# Patient Record
Sex: Female | Born: 1950
Health system: Southern US, Community
[De-identification: ages and names within clinical notes are randomized; demographics above are authoritative.]

## PROBLEM LIST (undated history)

## (undated) DIAGNOSIS — I2510 Atherosclerotic heart disease of native coronary artery without angina pectoris: Secondary | ICD-10-CM

## (undated) DIAGNOSIS — E785 Hyperlipidemia, unspecified: Secondary | ICD-10-CM

## (undated) DIAGNOSIS — I10 Essential (primary) hypertension: Secondary | ICD-10-CM

## (undated) DIAGNOSIS — Z720 Tobacco use: Secondary | ICD-10-CM

## (undated) DIAGNOSIS — N814 Uterovaginal prolapse, unspecified: Secondary | ICD-10-CM

## (undated) DIAGNOSIS — K759 Inflammatory liver disease, unspecified: Secondary | ICD-10-CM

## (undated) HISTORY — PX: TUBAL LIGATION: SHX77

## (undated) HISTORY — DX: Tobacco use: Z72.0

## (undated) HISTORY — DX: Atherosclerotic heart disease of native coronary artery without angina pectoris: I25.10

## (undated) HISTORY — DX: Essential (primary) hypertension: I10

---

## 1998-07-19 ENCOUNTER — Emergency Department (HOSPITAL_COMMUNITY): Admission: EM | Admit: 1998-07-19 | Discharge: 1998-07-19 | Payer: Self-pay | Admitting: Emergency Medicine

## 1998-10-02 ENCOUNTER — Emergency Department (HOSPITAL_COMMUNITY): Admission: EM | Admit: 1998-10-02 | Discharge: 1998-10-02 | Payer: Self-pay | Admitting: Emergency Medicine

## 1999-02-05 ENCOUNTER — Emergency Department (HOSPITAL_COMMUNITY): Admission: EM | Admit: 1999-02-05 | Discharge: 1999-02-05 | Payer: Self-pay | Admitting: Emergency Medicine

## 1999-02-15 ENCOUNTER — Encounter: Payer: Self-pay | Admitting: Emergency Medicine

## 1999-02-15 ENCOUNTER — Emergency Department (HOSPITAL_COMMUNITY): Admission: EM | Admit: 1999-02-15 | Discharge: 1999-02-15 | Payer: Self-pay | Admitting: Emergency Medicine

## 1999-05-03 ENCOUNTER — Emergency Department (HOSPITAL_COMMUNITY): Admission: EM | Admit: 1999-05-03 | Discharge: 1999-05-03 | Payer: Self-pay | Admitting: Emergency Medicine

## 2002-03-30 ENCOUNTER — Emergency Department (HOSPITAL_COMMUNITY): Admission: EM | Admit: 2002-03-30 | Discharge: 2002-03-30 | Payer: Self-pay | Admitting: Emergency Medicine

## 2002-03-30 ENCOUNTER — Encounter: Payer: Self-pay | Admitting: Emergency Medicine

## 2002-04-15 ENCOUNTER — Emergency Department (HOSPITAL_COMMUNITY): Admission: EM | Admit: 2002-04-15 | Discharge: 2002-04-15 | Payer: Self-pay | Admitting: Emergency Medicine

## 2002-04-15 ENCOUNTER — Encounter: Payer: Self-pay | Admitting: Emergency Medicine

## 2002-12-02 ENCOUNTER — Encounter: Payer: Self-pay | Admitting: Emergency Medicine

## 2002-12-02 ENCOUNTER — Emergency Department (HOSPITAL_COMMUNITY): Admission: EM | Admit: 2002-12-02 | Discharge: 2002-12-02 | Payer: Self-pay | Admitting: Emergency Medicine

## 2003-08-01 ENCOUNTER — Emergency Department (HOSPITAL_COMMUNITY): Admission: EM | Admit: 2003-08-01 | Discharge: 2003-08-01 | Payer: Self-pay | Admitting: Emergency Medicine

## 2003-12-24 ENCOUNTER — Emergency Department (HOSPITAL_COMMUNITY): Admission: EM | Admit: 2003-12-24 | Discharge: 2003-12-24 | Payer: Self-pay | Admitting: Emergency Medicine

## 2005-10-14 ENCOUNTER — Emergency Department (HOSPITAL_COMMUNITY): Admission: EM | Admit: 2005-10-14 | Discharge: 2005-10-14 | Payer: Self-pay | Admitting: Emergency Medicine

## 2007-03-05 ENCOUNTER — Emergency Department (HOSPITAL_COMMUNITY): Admission: EM | Admit: 2007-03-05 | Discharge: 2007-03-05 | Payer: Self-pay | Admitting: Emergency Medicine

## 2007-09-09 ENCOUNTER — Emergency Department (HOSPITAL_COMMUNITY): Admission: EM | Admit: 2007-09-09 | Discharge: 2007-09-09 | Payer: Self-pay | Admitting: Emergency Medicine

## 2010-04-19 ENCOUNTER — Emergency Department (HOSPITAL_COMMUNITY): Admission: EM | Admit: 2010-04-19 | Discharge: 2010-04-19 | Payer: Self-pay | Admitting: Emergency Medicine

## 2010-07-30 DIAGNOSIS — I2510 Atherosclerotic heart disease of native coronary artery without angina pectoris: Secondary | ICD-10-CM

## 2010-07-30 HISTORY — DX: Atherosclerotic heart disease of native coronary artery without angina pectoris: I25.10

## 2010-07-30 HISTORY — PX: CARDIAC CATHETERIZATION: SHX172

## 2010-08-04 ENCOUNTER — Ambulatory Visit: Payer: Self-pay | Admitting: Cardiovascular Disease

## 2010-08-08 ENCOUNTER — Telehealth (INDEPENDENT_AMBULATORY_CARE_PROVIDER_SITE_OTHER): Payer: Self-pay | Admitting: *Deleted

## 2010-08-09 ENCOUNTER — Encounter (HOSPITAL_COMMUNITY)
Admission: RE | Admit: 2010-08-09 | Discharge: 2010-08-29 | Payer: Self-pay | Source: Home / Self Care | Attending: Cardiovascular Disease | Admitting: Cardiovascular Disease

## 2010-08-09 ENCOUNTER — Encounter: Payer: Self-pay | Admitting: Cardiovascular Disease

## 2010-08-09 ENCOUNTER — Ambulatory Visit: Admission: RE | Admit: 2010-08-09 | Discharge: 2010-08-09 | Payer: Self-pay | Source: Home / Self Care

## 2010-08-10 ENCOUNTER — Ambulatory Visit: Admission: RE | Admit: 2010-08-10 | Discharge: 2010-08-10 | Payer: Self-pay | Source: Home / Self Care

## 2010-08-11 ENCOUNTER — Ambulatory Visit (HOSPITAL_COMMUNITY)
Admission: RE | Admit: 2010-08-11 | Discharge: 2010-08-12 | Payer: Self-pay | Source: Home / Self Care | Attending: Cardiovascular Disease | Admitting: Cardiovascular Disease

## 2010-08-14 ENCOUNTER — Ambulatory Visit: Payer: Self-pay | Admitting: Cardiovascular Disease

## 2010-08-14 LAB — BASIC METABOLIC PANEL
BUN: 6 mg/dL (ref 6–23)
CO2: 26 mEq/L (ref 19–32)
Calcium: 8.8 mg/dL (ref 8.4–10.5)
Chloride: 102 mEq/L (ref 96–112)
Creatinine, Ser: 0.61 mg/dL (ref 0.4–1.2)
GFR calc Af Amer: >60 mL/min (ref >60)
GFR calc non Af Amer: >60 mL/min (ref >60)
Glucose, Bld: 100 mg/dL — ABNORMAL HIGH (ref 70–99)
Potassium: 3.9 mEq/L (ref 3.5–5.1)
Sodium: 138 mEq/L (ref 135–145)

## 2010-08-14 LAB — CBC
HCT: 44.9 % (ref 36.0–46.0)
HCT: 43.4 % (ref 36.0–46.0)
Hemoglobin: 14.6 g/dL (ref 12.0–15.0)
Hemoglobin: 14.3 g/dL (ref 12.0–15.0)
MCH: 31.5 pg (ref 26.0–34.0)
MCH: 31.8 pg (ref 26.0–34.0)
MCHC: 32.5 g/dL (ref 30.0–36.0)
MCHC: 32.9 g/dL (ref 30.0–36.0)
MCV: 96.8 fL (ref 78.0–100.0)
MCV: 96.7 fL (ref 78.0–100.0)
Platelets: 180 10*3/uL (ref 150–400)
Platelets: 176 10*3/uL (ref 150–400)
RBC: 4.64 MIL/uL (ref 3.87–5.11)
RBC: 4.49 MIL/uL (ref 3.87–5.11)
RDW: 13 % (ref 11.5–15.5)
RDW: 12.9 % (ref 11.5–15.5)
WBC: 8.8 10*3/uL (ref 4.0–10.5)
WBC: 10.7 10*3/uL — ABNORMAL HIGH (ref 4.0–10.5)

## 2010-08-14 LAB — LIPID PANEL
Cholesterol: 200 mg/dL (ref 0–200)
HDL: 30 mg/dL — ABNORMAL LOW (ref >39)
LDL Cholesterol: 134 mg/dL — ABNORMAL HIGH (ref 0–99)
Total CHOL/HDL Ratio: 6.7 RATIO
Triglycerides: 182 mg/dL — ABNORMAL HIGH (ref <150)
VLDL: 36 mg/dL (ref 0–40)

## 2010-08-21 NOTE — Discharge Summary (Addendum)
Melanie Mccarty              ACCOUNT NO.:  [id number]  MEDICAL RECORD NO.:  [id number]          PATIENT TYPE:  OIB  LOCATION:  6524                         FACILITY:  [facility]  PHYSICIAN:  Colleen Can. Deborah Chalk, M.D.DATE OF BIRTH:  [birth year]  DATE OF ADMISSION:  08/11/2010 DATE OF DISCHARGE:  08/12/2010                              DISCHARGE SUMMARY   DISCHARGE DIAGNOSES: 1. Chest pain consistent with unstable angina. 2. Newly diagnosed coronary artery disease by cath, August 11, 2010     with total occluded right coronary artery treated with percutaneous     coronary intervention/Ion drug-eluting stent placement.     a.     Residual moderate left anterior descending coronary artery      and nonobstructive left circumflex stenosis, for medical therapy. 3. Hypertension. 4. Ongoing tobacco abuse, with possible chronic obstructive pulmonary     disease.  Instructed to follow up with primary care for evaluation     of lung function. 5. Normal left ventricular function by Myoview.  HOSPITAL COURSE:  Melanie Mccarty is a 60 year old female with no prior cardiac history, who presented to Dr. Elease Hashimoto with complaints of exertional chest pain, very typical of angina.  She underwent a Lexiscan Myoview study that demonstrated inferior wall ischemia with normal left ventricular function with an EF of 60%.  She was referred for cardiac catheterization and came in to the hospital on August 11, 2010 for this procedure by Dr. Excell Seltzer.  She had a total occlusion of the RCA, which was treated successfully with PCI and 3.5 x 32 mm Ion drug-eluting stent.  She did have residual nonobstructive disease in the left system, otherwise, for medical therapy.  The patient did well post procedurally. Overnight it was noted on telemetry with O2 sat monitoring that her sats did fall, so she was put on oxygen overnight.  This morning, her oxygen saturations are low 90s on room air with O2 sat ranging from 88-92  on ambulation.  Chest x-ray showed no active disease.  The patient was extensively counseled regarding smoking cessation.  She will be instructed to follow up with her primary care doctor for possible evaluation of COPD.  She was also seen by case management for Plavix assistance.  Dr. Deborah Chalk has seen and examined her today and feels she is stable for discharge.  DISCHARGE LABS:  WBC 10.6, hemoglobin of 14.3, hematocrit 43.4, platelet count 176.  Sodium 138, potassium 2.9, chloride 102, CO2 of 26, glucose 100, BUN 6, creatinine 0.61.  Lipid profile; total cholesterol 200, triglycerides 182, HDL 30, LDL 134.  STUDIES: 1. Chest x-ray on August 12, 2010 showed no acute findings. 2. Cardiac catheterization August 11, 2010, please see full report     for details as well as HPI for summary.  DISCHARGE MEDICATIONS: 1. Plavix 75 mg daily. 2. Lisinopril 5 mg daily. 3. Nitroglycerin 0.4 mg sublingual every 5 minutes as needed up to 3     doses. 4. Pravastatin 40 mg at bedtime. 5. Goody's Powder 1 packet only as needed. 6. Toprol-XL 25 mg daily. 7. Aspirin 81 mg daily.  DISPOSITION:  Ms. [name] will be  discharged in stable condition to home. She is not to return to work until August 15, 2010, and is not to lift anything or participate in sexual activity in 1 week.  She is not to drive for 2 days.  She is strongly counseled to stop smoking and follow a low-sodium, heart-healthy diet.  If she notices any pain, swelling, bleeding, or pus at the cath site, she is to call or return.  She will follow up with Dr. Elease Hashimoto in 2 weeks and our office will call her with this appointment.  She is also instructed to follow up with her primary care doctor to evaluate her lung function.  DURATION OF DISCHARGE ENCOUNTER:  Greater than 30 minutes including physician and PA time.     Dayna Dunn, P.A.C.   ______________________________ Colleen Can. Deborah Chalk, M.D.    DD/MEDQ  D:  08/12/2010  T:   08/12/2010  Job:  [id number]  cc:   Vesta Mixer, M.D.  Electronically Signed by Ronie Spies  on 08/21/2010 10:32:30 AM Electronically Signed by Roger Shelter M.D. on 08/24/2010 03:35:04 PM

## 2010-08-29 NOTE — H&P (Signed)
NAMELISMARY, KIEHN NO.:  [id number]  MEDICAL RECORD NO.:  [id number]           PATIENT TYPE:  LOCATION:                                 FACILITY:  PHYSICIAN:  Vesta Mixer, M.D. DATE OF BIRTH:  Nov 09, 1950  DATE OF ADMISSION:  08/11/2010 DATE OF DISCHARGE:                             HISTORY & PHYSICAL   HISTORY:  Melanie Mccarty is a middle-aged female with a history of chest pain.  She was recently found to have an abnormal stress Myoview study. She is now admitted for heart catheterization.  Melanie Mccarty saw me initially on August 05, 2010.  She had been having some episodes of chest pain.  These episodes have been present since last October.  The pains are described as a squeezing type chest pain. The episodes last for several minutes.  They radiate out to her arm. These episodes are brought on by exertion.  They are not associated with shortness of breath, nausea, vomiting, or diaphoresis.  She denies any syncope or presyncope.  She had a [place] Myoview study today.  She was found to have evidence of inferior wall ischemia.  Her left ventricular systolic function was normal with an ejection fraction of 60%.  CURRENT MEDICATIONS: 1. Metoprolol 25 mg a day. 2. Nitroglycerin 0.4 mg sublingually as needed.  PAST MEDICAL HISTORY: 1. History of hypertension. 2. Chest pain.  SOCIAL HISTORY:  The patient smokes 1-pack of cigarettes a day.  She works as a Conservation officer, nature.  She does not drink alcohol.  She does not get any regular exercise.  FAMILY HISTORY:  Her father died at age 81 due to congestive heart failure.  He also had diabetes.  Her mother died at age 72 of heart failure.  She also had hypertension.  She had a sister who had coronary artery bypass grafting in her 49s.  REVIEW OF SYSTEMS:  As noted in the HPI.  All other systems are negative.  PHYSICAL EXAMINATION:  GENERAL:  She is a middle-aged female in no acute distress.  She is alert and  oriented x3.  Her mood and affect are normal. VITAL SIGNS:  Her weight is 173, her blood pressure is 150/80, and her heart rate is 60. HEENT:  2+ carotids.  She has no bruits, no JVD, and no thyromegaly. Her sclerae are nonicteric.  Her mucous membranes are moist. LUNGS:  Clear. CHEST:  Her chest wall is nontender. HEART:  Regular rate, S1-S2.  She has normal heart sounds.  Her PMI is nondisplaced. ABDOMEN:  Good bowel sounds.  There is no guarding or rebound.  There is no hepatosplenomegaly.  There are no masses. EXTREMITIES:  Examination of her legs reveal that her pulses are fairly deep in her legs.  Her foot pulses are normal.  Her arm pulses are normal.  There is no palpable cords. NEUROLOGIC:  Her cranial nerves II-XII are intact and her motor and sensory function are intact.  Her gait is normal.  IMAGING:  Her EKG reveals normal sinus rhythm.  She has some nonspecific ST-T wave changes in the inferolateral leads.  IMPRESSION AND PLAN:  Chest  pain.  The patient presents with a history of chest pain that is very typical of angina.  She also has evidence of inferior ischemia.  We will schedule her for a heart catheterization tomorrow with Dr. Excell Seltzer.  We have discussed the risks, benefits, and options of heart catheterization.  She understands and agrees to proceed.     Vesta Mixer, M.D.     PJN/MEDQ  D:  08/10/2010  T:  08/11/2010  Job:  [id number]  cc:   Melvern Banker Veverly Fells. Excell Seltzer, MD  Electronically Signed by Kristeen Miss M.D. on 08/29/2010 12:14:20 PM

## 2010-08-29 NOTE — Procedures (Signed)
NAMESOMALY, MARTENEY              ACCOUNT NO.:  [id number]  MEDICAL RECORD NO.:  [id number]          PATIENT TYPE:  OIB  LOCATION:  6524                         FACILITY:  [facility]  PHYSICIAN:  Veverly Fells. Excell Seltzer, MD  DATE OF BIRTH:  07-Mar-1951  DATE OF PROCEDURE:  08/11/2010 DATE OF DISCHARGE:                           CARDIAC CATHETERIZATION   PROCEDURES: 1. Catheter placement for selective coronary angiography. 2. Selective coronary angiography. 3. Percutaneous transluminal coronary angioplasty and stenting of the     right coronary artery.  PROCEDURAL INDICATIONS:  Melanie Mccarty is a 60 year old woman who developed exertional chest pain in October 2011.  Her symptoms have been very typical of angina.  She was seen by Dr. Elease Hashimoto and underwent a Lexiscan Myoview study demonstrating inferior wall ischemia with normal LV ejection fraction of 60%.  She was referred for cardiac cath.  Risks and indications of procedure were reviewed with the patient. Informed consent was obtained.  The right wrist was prepped, draped, and anesthetized with 1% lidocaine.  Using a modified Seldinger technique, a 5-French sheath was placed in the right radial artery, 3 mg of verapamil was administered through the sheath, 4000 units of unfractionated heparin was given, standard 5-French Judkins catheters were used for coronary angiography.  This demonstrated total occlusion of the proximal right coronary artery with left-to-right collaterals.  Since the patient had an ischemic defect with preserved LV function and class III angina, I elected to try to reopen her chronic total occlusion.  An ACT was drawn and it was approximately 225.  An additional 1000 units of heparin was given.  A 5-French JR-4 guide catheter was utilized.  I initially attempted to cross the lesion with a cougar guidewire, but this would not cross.  I switched out to an over-the-wire balloon using a 1.5 x 12 mm apex with a Miracle  Brothers 3 g  CTO wire.  I was able to cross the lesion with a Miracle Brothers wire and the balloon crossed with a moderate amount of difficulty.  The balloon was inflated to 10 and 12 atmospheres on two inflations and was then pulled back. Angiography was then performed and there was a long area of severe diffuse narrowing throughout the entire proximal and mid right coronary artery where the vessel took a near 360-degree loop and then it became a larger caliber vessel in the distal RCA.  At that point, the Miracle Brothers wire was then changed out for a long Cougar guidewire and a 1-5 balloon was inflated throughout the entirety of the mid-right coronary artery.  Angiography again demonstrated severe diffuse disease with essentially no flow beyond the mid-right coronary.  A 2.5 x 40 mm balloon was then advanced and dilated to 6 atmospheres on a total of three inflations to cover the entirety of the RCA.  Intracoronary nitroglycerin was administered.  This made a dramatic difference in the appearance of the vessel and relieved much of what was presumably diffuse vasospasm.  Other than the proximal portion of the vessel, the entire vessel appeared patent.  I elected to stent the vessel at that point with a 3.5 x 32 mm  Ion drug-eluting stent.  The stent was carefully positioned so that there was coverage of the ostium and the stent was deployed at 12 atmospheres.  It was postdilated with a 3.75 x 20 mm [employer] Quantum apex which was taken to 12 atmospheres distally and 16 atmospheres proximally.  Final angiography demonstrated an excellent result with TIMI III flow and 0% residual stenosis.  DIAGNOSTIC FINDINGS:  Left mainstem has 20-30% distal left main stenosis.  It divides into the LAD and left circumflex.  LAD:  The LAD has calcification and diffuse 50% narrowing proximally. The mid vessel has minor nonobstructive plaque with associated calcification.  The diagonal branches are  patent.  Left circumflex:  The left circumflex is patent throughout its course. There are two large OM branches, just after the first OM and the midcircumflex, there is a calcific 30-40% stenosis present.  There are no areas of high-grade disease in the left circumflex.  Right coronary artery:  The right coronary artery has total occlusion at the ostium.  There is minimal antegrade flow beyond the area of total occlusion.  The PDA fills from left-to-right collaterals.  FINAL ASSESSMENT: 1. Total occlusion of the right coronary artery treated successfully     with percutaneous intervention using a drug-eluting stent platform. 2. Moderate left anterior descending stenosis. 3. Nonobstructive left circumflex stenosis.  RECOMMENDATIONS:  The patient should continue with dual antiplatelet therapy using aspirin and Plavix for a minimum of 12 months.  Aggressive risk reduction will be continued under the care of Dr. Elease Hashimoto.  Of note, the PCI procedure was performed with heparin; and once the lesion was crossed and then treated with successful balloon angioplasty, Integrilin was started as adjunctive therapy.  The patient was given 600 mg ofPlavix at the completion of the procedure and will be continued on 12 hours of Integrilin.     Veverly Fells. Excell Seltzer, MD     MDC/MEDQ  D:  08/11/2010  T:  08/12/2010  Job:  [id number]  cc:   Vesta Mixer, M.D. [facility]  Electronically Signed by Tonny Bollman MD on 08/29/2010 04:50:20 AM

## 2010-08-30 ENCOUNTER — Ambulatory Visit (INDEPENDENT_AMBULATORY_CARE_PROVIDER_SITE_OTHER): Payer: PRIVATE HEALTH INSURANCE | Admitting: Cardiovascular Disease

## 2010-08-30 DIAGNOSIS — Z9861 Coronary angioplasty status: Secondary | ICD-10-CM

## 2010-08-30 DIAGNOSIS — I119 Hypertensive heart disease without heart failure: Secondary | ICD-10-CM

## 2010-08-30 DIAGNOSIS — E7800 Pure hypercholesterolemia, unspecified: Secondary | ICD-10-CM

## 2010-08-31 NOTE — Progress Notes (Signed)
Summary: Nuclear Pre-Procedure  Phone Note Outgoing Call Call back at [facility] Phone [phone]   Call placed by: Stanton Kidney, EMT-P,  August 08, 2010 1:35 PM Action Taken: Phone Call Completed Summary of Call: Left message with information on Myoview Information Sheet (see scanned document for details). Stanton Kidney, EMT-P  August 08, 2010 1:35 PM     Nuclear Med Background Indications for Stress Test: Evaluation for Ischemia     Symptoms: Chest Pain, Chest Pain with Exertion    Nuclear Pre-Procedure Cardiac Risk Factors: Family History - CAD, Hypertension, Smoker

## 2010-08-31 NOTE — Assessment & Plan Note (Signed)
Summary: Cardiology Nuclear Testing  Nuclear Med Background Indications for Stress Test: Evaluation for Ischemia   History: Abnormal EKG   Symptoms: Chest Pain, Chest Pain with Exertion, DOE, Fatigue    Nuclear Pre-Procedure Cardiac Risk Factors: Family History - CAD, Hypertension, Smoker Caffeine/Decaff Intake: None NPO After: 9:00 PM Lungs: clear IV 0.9% NS with Angio Cath: 20g     IV Site: R Antecubital IV Started by: Stanton Kidney, EMT-P Chest Size (in) 40     Cup Size D     Height (in): 63 Weight (lb): 172 BMI: 30.58 Tech Comments: Metoprolol held this morning, per patient. NOTE: Study and images discussed with Dr. Elease Hashimoto by Lilian Coma on 1/12 after Resting Study completed and appt. was made for pt. to see Dr. Elease Hashimoto 08/10/10 at 10:45am.  W.Deal,RT-N  Nuclear Med Study 1 or 2 day study:  2 day     Stress Test Type:  Treadmill/Lexiscan Reading MD:  Charlton Haws, MD     Referring MD:  P.Nahser Resting Radionuclide:  Technetium 78m Tetrofosmin     Resting Radionuclide Dose:  33.0 mCi  Stress Radionuclide:  Technetium 53m Tetrofosmin     Stress Radionuclide Dose:  33.0 mCi   Stress Protocol  Max Systolic BP: 189 mm Hg Lexiscan: 0.4 mg   Stress Test Technologist:  Milana Na, EMT-P     Nuclear Technologist:  Doyne Keel, CNMT  Rest Procedure  Myocardial perfusion imaging was performed at rest 45 minutes following the intravenous administration of Technetium 40m Tetrofosmin.  Stress Procedure  The patient received IV Lexiscan 0.4 mg over 15-seconds with concurrent low level exercise and then Technetium 5m Tetrofosmin was injected at 30-seconds while the patient continued walking one more minute. The patient had chest pressure and leg weakness with infusion. There were significant changes with Lexiscan.  Quantitative spect images were obtained after a 45 minute delay.  QPS Raw Data Images:  Normal; no motion artifact; normal heart/lung ratio. Stress Images:   Decreased inferior counts Rest Images:  Normal homogeneous uptake in all areas of the myocardium. Subtraction (SDS):  SDS 10 with inferior ischemia Transient Ischemic Dilatation:  0.95  (Normal <1.22)  Lung/Heart Ratio:  0.29  (Normal <0.45)  Quantitative Gated Spect Images QGS EDV:  90 ml QGS ESV:  36 ml QGS EF:  60 %  Findings High risk nuclear study Evidence for inferior ischemia      Overall Impression  Exercise Capacity: Lexiscan with no exercise. BP Response: Normal blood pressure response. Clinical Symptoms: Chest Pain ECG Impression: More ST changes inferolaterally with lexiscan Overall Impression: Significant inferior wall ischemia from apex to base.  F/U with Dr Elease Hashimoto today  Appended Document: Cardiology Nuclear Testing copy sent to Dr. Melburn Popper

## 2010-10-12 LAB — URINALYSIS, ROUTINE W REFLEX MICROSCOPIC
Bilirubin Urine: NEGATIVE
Glucose, UA: NEGATIVE mg/dL
Hgb urine dipstick: NEGATIVE
Ketones, ur: NEGATIVE mg/dL
Nitrite: NEGATIVE
Protein, ur: NEGATIVE mg/dL
Specific Gravity, Urine: 1.006 (ref 1.005–1.030)
Urobilinogen, UA: 0.2 mg/dL (ref 0.0–1.0)
pH: 6 (ref 5.0–8.0)

## 2010-10-12 LAB — COMPREHENSIVE METABOLIC PANEL
ALT: 17 U/L (ref 0–35)
AST: 29 U/L (ref 0–37)
Albumin: 4.1 g/dL (ref 3.5–5.2)
Alkaline Phosphatase: 87 U/L (ref 39–117)
BUN: 12 mg/dL (ref 6–23)
CO2: 24 mEq/L (ref 19–32)
Calcium: 9.3 mg/dL (ref 8.4–10.5)
Chloride: 107 mEq/L (ref 96–112)
Creatinine, Ser: 0.54 mg/dL (ref 0.4–1.2)
GFR calc Af Amer: >60 mL/min (ref >60)
GFR calc non Af Amer: >60 mL/min (ref >60)
Glucose, Bld: 84 mg/dL (ref 70–99)
Potassium: 4.7 mEq/L (ref 3.5–5.1)
Sodium: 140 mEq/L (ref 135–145)
Total Bilirubin: 0.6 mg/dL (ref 0.3–1.2)
Total Protein: 7.5 g/dL (ref 6.0–8.3)

## 2010-10-12 LAB — DIFFERENTIAL
Basophils Absolute: 0 10*3/uL (ref 0.0–0.1)
Basophils Relative: 0 % (ref 0–1)
Eosinophils Absolute: 0.2 10*3/uL (ref 0.0–0.7)
Eosinophils Relative: 2 % (ref 0–5)
Lymphocytes Relative: 37 % (ref 12–46)
Lymphs Abs: 3.2 10*3/uL (ref 0.7–4.0)
Monocytes Absolute: 0.6 10*3/uL (ref 0.1–1.0)
Monocytes Relative: 7 % (ref 3–12)
Neutro Abs: 4.7 10*3/uL (ref 1.7–7.7)
Neutrophils Relative %: 54 % (ref 43–77)

## 2010-10-12 LAB — CBC
HCT: 48.5 % — ABNORMAL HIGH (ref 36.0–46.0)
Hemoglobin: 16.5 g/dL — ABNORMAL HIGH (ref 12.0–15.0)
MCH: 32.7 pg (ref 26.0–34.0)
MCHC: 34 g/dL (ref 30.0–36.0)
MCV: 96 fL (ref 78.0–100.0)
Platelets: 171 10*3/uL (ref 150–400)
RBC: 5.05 MIL/uL (ref 3.87–5.11)
RDW: 13.4 % (ref 11.5–15.5)
WBC: 8.8 10*3/uL (ref 4.0–10.5)

## 2010-10-12 LAB — POCT CARDIAC MARKERS
CKMB, poc: <1 ng/mL — ABNORMAL LOW (ref 1.0–8.0)
Myoglobin, poc: 36.6 ng/mL (ref 12–200)
Troponin i, poc: <0.05 ng/mL (ref 0.00–0.09)

## 2010-10-12 LAB — LIPASE, BLOOD: Lipase: 37 U/L (ref 11–59)

## 2010-10-12 LAB — D-DIMER, QUANTITATIVE: D-Dimer, Quant: 0.37 ug/mL-FEU (ref 0.00–0.48)

## 2010-11-24 ENCOUNTER — Encounter: Payer: Self-pay | Admitting: Cardiovascular Disease

## 2010-11-24 DIAGNOSIS — I2510 Atherosclerotic heart disease of native coronary artery without angina pectoris: Secondary | ICD-10-CM | POA: Insufficient documentation

## 2010-11-24 DIAGNOSIS — I10 Essential (primary) hypertension: Secondary | ICD-10-CM | POA: Insufficient documentation

## 2010-12-04 ENCOUNTER — Ambulatory Visit (INDEPENDENT_AMBULATORY_CARE_PROVIDER_SITE_OTHER): Payer: PRIVATE HEALTH INSURANCE | Admitting: Cardiovascular Disease

## 2010-12-04 ENCOUNTER — Encounter: Payer: Self-pay | Admitting: Cardiovascular Disease

## 2010-12-04 VITALS — BP 168/78 | HR 72 | Ht 63.0 in | Wt 164.0 lb

## 2010-12-04 DIAGNOSIS — I2510 Atherosclerotic heart disease of native coronary artery without angina pectoris: Secondary | ICD-10-CM

## 2010-12-04 DIAGNOSIS — F17200 Nicotine dependence, unspecified, uncomplicated: Secondary | ICD-10-CM | POA: Insufficient documentation

## 2010-12-04 DIAGNOSIS — I10 Essential (primary) hypertension: Secondary | ICD-10-CM

## 2010-12-04 MED ORDER — AMLODIPINE BESYLATE 2.5 MG PO TABS: 2.5000 mg | ORAL_TABLET | Freq: Every day | ORAL | Status: DC

## 2010-12-04 NOTE — Assessment & Plan Note (Signed)
Melanie Mccarty is doing well from a cardiac standpoint. I'm glad to she's not had any episodes of chest pain. I've asked her to stop smoking as this will help reduce the risk of further cardiac events.

## 2010-12-04 NOTE — Progress Notes (Signed)
Melanie Mccarty Date of Birth  16-Jan-1951 [facility] [address]     [address] [place], [place]  [address] [phone]  Fax  [phone]  History of Present Illness:  No chest pain or dyspnea.  Doing normal activities without any problems. She has been under lots of stress due to family problems.  She continues to smoke 1/2 pack of cigarettes a day.  Current Outpatient Prescriptions on File Prior to Visit  Medication Sig Dispense Refill  . aspirin 81 MG tablet Take 81 mg by mouth daily.        . clopidogrel (PLAVIX) 75 MG tablet Take 75 mg by mouth daily.        Marland Kitchen lisinopril (PRINIVIL,ZESTRIL) 20 MG tablet Take 20 mg by mouth daily.        Marland Kitchen PRAVASTATIN SODIUM PO Take 40 mg by mouth daily.       . nitroGLYCERIN (NITROSTAT) 0.4 MG SL tablet Place 0.4 mg under the tongue every 5 (five) minutes as needed.        Marland Kitchen DISCONTD: metoprolol tartrate (LOPRESSOR) 25 MG tablet Take 25 mg by mouth daily.          Allergies  Allergen Reactions  . Penicillins     Past Medical History  Diagnosis Date  . Coronary artery disease     STATUS POST PTCA AND STENTING OF HER RIGHT CORONARY ARTERY  . Hypertension     Past Surgical History  Procedure Date  . Tubal ligation     History  Smoking status  . Current Everyday Smoker -- 0.5 packs/day  . Types: Cigarettes  Smokeless tobacco  . Not on file    History  Alcohol Use No    Family History  Problem Relation Age of Onset  . Heart failure Mother 64  . Hypertension Mother   . Heart failure Father 67  . Diabetes type II Father   . Hypertension Sister   . Heart failure Brother 42    Reviw of Systems:  Reviewed in the HPI.  All other systems are negative.  Physical Exam: BP 168/78  Pulse 72  Ht 5\' 3"  (1.6 m)  Wt 164 lb (74.39 kg)  BMI 29.05 kg/m2 The patient is alert and oriented x 3.  The mood and affect are normal.  The skin is warm and dry.  Color is normal.  The HEENT exam  reveals that the sclera are nonicteric.  The mucous membranes are moist.  The carotids are 2+ without bruits.  There is no thyromegaly.  There is no JVD.  The lungs are clear.  The chest wall is non tender.  The heart exam reveals a regular rate with a normal S1 and S2.  There are no murmurs, gallops, or rubs.  The PMI is not displaced.   Abdominal exam reveals good bowel sounds.  There is no guarding or rebound.  There is no hepatosplenomegaly or tenderness.  There are no masses.  Exam of the legs reveal no clubbing, cyanosis, or edema.  The legs are without rashes.  The distal pulses are intact.  Cranial nerves II - XII are intact.  Motor and sensory functions are intact.  The gait is normal.  ECG:  Assessment / Plan:

## 2010-12-04 NOTE — Assessment & Plan Note (Signed)
Her blood pressure is mildly elevated. We will add amlodipine 2.5 mg a day. I'll see her again in 6 months.

## 2011-03-15 ENCOUNTER — Telehealth: Payer: Self-pay | Admitting: Cardiovascular Disease

## 2011-03-15 MED ORDER — PRAVASTATIN SODIUM 40 MG PO TABS: 40.0000 mg | ORAL_TABLET | Freq: Every day | ORAL | Status: DC

## 2011-03-15 NOTE — Telephone Encounter (Signed)
Patient request refill. Done Jodette Rexford Prevo RN  

## 2011-03-15 NOTE — Telephone Encounter (Signed)
Pt wants refill of pravastatin. She uses [facility]. Please call

## 2011-03-21 ENCOUNTER — Telehealth: Payer: Self-pay | Admitting: Cardiovascular Disease

## 2011-03-21 MED ORDER — METOPROLOL SUCCINATE ER 25 MG PO TB24: 25.0000 mg | ORAL_TABLET | Freq: Every day | ORAL | Status: AC

## 2011-03-21 NOTE — Telephone Encounter (Signed)
Patient request refill. Done Alfonso Ramus RN

## 2011-03-21 NOTE — Telephone Encounter (Signed)
Would like for RN to call her TOPROL into to [facility].

## 2011-03-23 ENCOUNTER — Telehealth: Payer: Self-pay | Admitting: Cardiovascular Disease

## 2011-03-23 MED ORDER — CLOPIDOGREL BISULFATE 75 MG PO TABS: 75.0000 mg | ORAL_TABLET | Freq: Every day | ORAL | Status: DC

## 2011-03-23 NOTE — Telephone Encounter (Signed)
Pt wanted to know if refill for toprol was called to [facility] and also she needs a refill for plavix too please call

## 2011-03-23 NOTE — Telephone Encounter (Signed)
Patient request refill. done Alfonso Ramus RN

## 2011-06-01 ENCOUNTER — Other Ambulatory Visit (HOSPITAL_COMMUNITY): Payer: Self-pay | Admitting: Family Medicine

## 2011-06-01 DIAGNOSIS — Z1231 Encounter for screening mammogram for malignant neoplasm of breast: Secondary | ICD-10-CM

## 2011-06-01 DIAGNOSIS — Z780 Asymptomatic menopausal state: Secondary | ICD-10-CM

## 2011-06-04 ENCOUNTER — Ambulatory Visit: Payer: Self-pay | Admitting: Cardiovascular Disease

## 2011-06-04 ENCOUNTER — Other Ambulatory Visit: Payer: Self-pay | Admitting: *Deleted

## 2011-06-05 ENCOUNTER — Other Ambulatory Visit (HOSPITAL_COMMUNITY): Payer: Self-pay | Admitting: Family Medicine

## 2011-06-05 DIAGNOSIS — R0602 Shortness of breath: Secondary | ICD-10-CM

## 2011-06-12 ENCOUNTER — Other Ambulatory Visit (HOSPITAL_COMMUNITY): Payer: Self-pay | Admitting: Family Medicine

## 2011-06-12 ENCOUNTER — Ambulatory Visit (HOSPITAL_COMMUNITY)
Admission: RE | Admit: 2011-06-12 | Discharge: 2011-06-12 | Disposition: A | Discharge status: Home / Self Care | Payer: Self-pay | Source: Ambulatory Visit | Attending: Family Medicine | Admitting: Family Medicine

## 2011-06-12 DIAGNOSIS — R0602 Shortness of breath: Secondary | ICD-10-CM

## 2011-06-13 ENCOUNTER — Ambulatory Visit (INDEPENDENT_AMBULATORY_CARE_PROVIDER_SITE_OTHER): Payer: Self-pay | Admitting: Cardiovascular Disease

## 2011-06-13 ENCOUNTER — Encounter: Payer: Self-pay | Admitting: Cardiovascular Disease

## 2011-06-13 ENCOUNTER — Other Ambulatory Visit: Payer: Self-pay | Admitting: Cardiovascular Disease

## 2011-06-13 ENCOUNTER — Other Ambulatory Visit (INDEPENDENT_AMBULATORY_CARE_PROVIDER_SITE_OTHER): Payer: Self-pay | Admitting: *Deleted

## 2011-06-13 VITALS — BP 139/74 | HR 66 | Ht 62.0 in | Wt 165.0 lb

## 2011-06-13 DIAGNOSIS — E785 Hyperlipidemia, unspecified: Secondary | ICD-10-CM

## 2011-06-13 DIAGNOSIS — I2510 Atherosclerotic heart disease of native coronary artery without angina pectoris: Secondary | ICD-10-CM

## 2011-06-13 DIAGNOSIS — I10 Essential (primary) hypertension: Secondary | ICD-10-CM

## 2011-06-13 LAB — LIPID PANEL
Cholesterol: 176 mg/dL (ref 0–200)
HDL: 37.9 mg/dL — ABNORMAL LOW (ref >39.00)
Total CHOL/HDL Ratio: 5
Triglycerides: 239 mg/dL — ABNORMAL HIGH (ref 0.0–149.0)

## 2011-06-13 LAB — BASIC METABOLIC PANEL
Calcium: 9.1 mg/dL (ref 8.4–10.5)
Creatinine, Ser: 0.6 mg/dL (ref 0.4–1.2)
GFR: 100.67 mL/min (ref >60.00)
Glucose, Bld: 83 mg/dL (ref 70–99)
Sodium: 141 mEq/L (ref 135–145)

## 2011-06-13 LAB — HEPATIC FUNCTION PANEL
Albumin: 4 g/dL (ref 3.5–5.2)
Alkaline Phosphatase: 69 U/L (ref 39–117)
Bilirubin, Direct: 0.1 mg/dL (ref 0.0–0.3)

## 2011-06-13 LAB — LDL CHOLESTEROL, DIRECT: Direct LDL: 113 mg/dL

## 2011-06-13 NOTE — Assessment & Plan Note (Signed)
[name] doing very well. She has not had any episodes of chest pain or shortness breath. I have encouraged her to stop smoking. I'll see her again in 6 months for an office visit and fasting labs.

## 2011-06-13 NOTE — Progress Notes (Signed)
  Melanie Mccarty Date of Birth  [birth year] [facility] [address]    [address] [place], [place]  [address] [phone]  Fax  [phone]  History of Present Illness:  Melanie Mccarty is a 60 year old female with a history of coronary artery disease. She status post PTCA and stenting of her right coronary artery using a 2.5 x 32 mm drug-eluting stent. She also has a history of hypertension.  [name] done very well since I last saw her in January. [name] not had any episodes of chest pain or shortness of breath.  Current Outpatient Prescriptions on File Prior to Visit  Medication Sig Dispense Refill  . amLODipine (NORVASC) 2.5 MG tablet Take 1 tablet (2.5 mg total) by mouth daily.  30 tablet  11  . aspirin 81 MG tablet Take 81 mg by mouth daily.        . clopidogrel (PLAVIX) 75 MG tablet Take 1 tablet (75 mg total) by mouth daily.  30 tablet  5  . lisinopril (PRINIVIL,ZESTRIL) 20 MG tablet Take 20 mg by mouth daily.        . metoprolol succinate (TOPROL-XL) 25 MG 24 hr tablet Take 1 tablet (25 mg total) by mouth daily.  30 tablet  5  . nitroGLYCERIN (NITROSTAT) 0.4 MG SL tablet Place 0.4 mg under the tongue every 5 (five) minutes as needed.        . pravastatin (PRAVACHOL) 40 MG tablet Take 1 tablet (40 mg total) by mouth daily.  30 tablet  5    Allergies  Allergen Reactions  . Penicillins     Past Medical History  Diagnosis Date  . Coronary artery disease     STATUS POST PTCA AND STENTING OF HER RIGHT CORONARY ARTERY  . Hypertension   . Chest pain      consistent with unstable angina  . Tobacco abuse     with possible chronic obstructive pulmonary   disease.  Instructed to follow up with primary care for evaluation   of lung function.     Past Surgical History  Procedure Date  . Tubal ligation   . Cardiac catheterization     ejection fraction of 60%    History  Smoking status  . Current Everyday Smoker -- 0.5 packs/day  . Types: Cigarettes  Smokeless tobacco    . Not on file    History  Alcohol Use No    Family History  Problem Relation Age of Onset  . Heart failure Mother 33  . Hypertension Mother   . Heart failure Father 53  . Diabetes type II Father   . Hypertension Sister   . Heart failure Brother 28    Reviw of Systems:  Reviewed in the HPI.  All other systems are negative.  Physical Exam: BP 139/74  Pulse 66  Ht 5\' 2"  (1.575 m)  Wt 165 lb (74.844 kg)  BMI 30.18 kg/m2 The patient is alert and oriented x 3.  The mood and affect are normal.   Skin: warm and dry.  Color is normal.    HEENT:   Normocephalic/atraumatic. She has a tender no bruits.  Lungs: Clear to auscultation.   Heart: Regular rate S1-S2.    Abdomen: +BS,   Extremities:  No c/c/e  Neuro:  Non focal    ECG: NSR, ST T abn. In inferior lateral leads - no changes from previous tracing   Assessment / Plan:

## 2011-06-13 NOTE — Patient Instructions (Signed)
Your physician wants you to follow-up in: 6 month  You will receive a reminder letter in the mail two months in advance. If you don't receive a letter, please call our office to schedule the follow-up appointment.  Your physician recommends that you return for a FASTING lipid profile: 6 month

## 2011-06-13 NOTE — Assessment & Plan Note (Signed)
[name] doing fairly well. We'll continue with her same medications.

## 2011-06-15 ENCOUNTER — Telehealth: Payer: Self-pay | Admitting: *Deleted

## 2011-06-15 MED ORDER — ATORVASTATIN CALCIUM 40 MG PO TABS: 40.0000 mg | ORAL_TABLET | Freq: Every day | ORAL | Status: DC

## 2011-06-15 NOTE — Telephone Encounter (Signed)
Message copied by Antony Odea on Fri Jun 15, 2011  5:08 PM ------      Message from: [place], [place] J      Created: Wed Jun 13, 2011  5:52 PM       Dc pravastatin - it's not strong enough.  staart Lipitor 40 mg daily. Recheck lipids, hfp, bmp in 3 months.

## 2011-06-15 NOTE — Telephone Encounter (Signed)
Patient called with lab results. Pt verbalized understanding. Will call script to [facility] on Monday Alfonso Ramus RN

## 2011-06-18 ENCOUNTER — Other Ambulatory Visit: Payer: Self-pay | Admitting: *Deleted

## 2011-06-18 MED ORDER — PRAVASTATIN SODIUM 40 MG PO TABS: 40.0000 mg | ORAL_TABLET | Freq: Every evening | ORAL | Status: DC

## 2011-06-18 NOTE — Telephone Encounter (Signed)
We were unable to switch pt to atorvastatin, [facility] doesn't provide that any longer, kept pt on same med Pravachol. Called pt MSG left of situation.

## 2011-06-29 ENCOUNTER — Ambulatory Visit (HOSPITAL_COMMUNITY)
Admission: RE | Admit: 2011-06-29 | Discharge: 2011-06-29 | Disposition: A | Discharge status: Home / Self Care | Payer: Self-pay | Source: Ambulatory Visit | Attending: Family Medicine | Admitting: Family Medicine

## 2011-06-29 DIAGNOSIS — Z13820 Encounter for screening for osteoporosis: Secondary | ICD-10-CM | POA: Insufficient documentation

## 2011-06-29 DIAGNOSIS — Z780 Asymptomatic menopausal state: Secondary | ICD-10-CM | POA: Insufficient documentation

## 2011-06-29 DIAGNOSIS — Z1231 Encounter for screening mammogram for malignant neoplasm of breast: Secondary | ICD-10-CM

## 2011-09-18 ENCOUNTER — Emergency Department (HOSPITAL_COMMUNITY)
Admission: EM | Admit: 2011-09-18 | Discharge: 2011-09-18 | Disposition: A | Discharge status: Home Health | Payer: Self-pay | Attending: Emergency Medicine | Admitting: Emergency Medicine

## 2011-09-18 ENCOUNTER — Encounter (HOSPITAL_COMMUNITY): Payer: Self-pay | Admitting: Emergency Medicine

## 2011-09-18 DIAGNOSIS — I10 Essential (primary) hypertension: Secondary | ICD-10-CM | POA: Insufficient documentation

## 2011-09-18 DIAGNOSIS — Z7982 Long term (current) use of aspirin: Secondary | ICD-10-CM | POA: Insufficient documentation

## 2011-09-18 DIAGNOSIS — I2510 Atherosclerotic heart disease of native coronary artery without angina pectoris: Secondary | ICD-10-CM | POA: Insufficient documentation

## 2011-09-18 DIAGNOSIS — Z79899 Other long term (current) drug therapy: Secondary | ICD-10-CM | POA: Insufficient documentation

## 2011-09-18 DIAGNOSIS — F17200 Nicotine dependence, unspecified, uncomplicated: Secondary | ICD-10-CM | POA: Insufficient documentation

## 2011-09-18 DIAGNOSIS — N8110 Cystocele, unspecified: Secondary | ICD-10-CM | POA: Insufficient documentation

## 2011-09-18 NOTE — Discharge Instructions (Signed)
Prolapse  Prolapse means the falling down, bulging, dropping, or drooping of a body part. Organs that commonly prolapse include the rectum, small intestine, bladder, urethra, vagina (birth canal), uterus (womb), and cervix. Prolapse occurs when the ligaments and muscle tissue around the rectum, bladder, and uterus are damaged or weakened.  CAUSES  This happens especially with:  Childbirth. Some women feel pelvic pressure or have trouble holding their urine right after childbirth, because of stretching and tearing of pelvic tissues. This generally gets better with time and the feeling usually goes away, but it may return with aging.   Chronic heavy lifting.   Aging.   Menopause, with loss of estrogen production weakening the pelvic ligaments and muscles.   Past pelvic surgery.   Obesity.   Chronic constipation.   Chronic cough.  Prolapse may affect a single organ, or several organs may prolapse at the same time. The front wall of the vagina holds up the bladder. The back wall holds up part of the lower intestine, or rectum. The uterus fills a spot in the middle. All these organs can be involved when the ligaments and muscles around the vagina relax too much. This often gets worse when women stop producing estrogen (menopause). SYMPTOMS  Uncontrolled loss of urine (incontinence) with cough, sneeze, straining, and exercise.   More force may be required to have a bowel movement, due to trapping of the stool.   When part of an organ bulges through the opening of the vagina, there is sometimes a feeling of heaviness or pressure. It may feel as though something is falling out. This sensation increases with coughing or bearing down.   If the organs protrude through the opening of the vagina and rub against the clothing, there may be soreness, ulcers, infection, pain, and bleeding.   Lower back pain.   Pushing in the upper or lower part of the vagina, to pass urine or have a bowel movement.     Problems having sexual intercourse.   Being unable to insert a tampon or applicator.  DIAGNOSIS  Usually, a physical exam is all that is needed to identify the problem. During the examination, you may be asked to cough and strain while lying down, sitting up, and standing up. Your caregiver will determine if more testing is required, such as bladder function tests. Some diagnoses are:  Cystocele: Bulging and falling of the bladder into the top of the vagina.   Rectocele: Part of the rectum bulging into the vagina.   Prolapse of the uterus: The uterus falls or drops into the vagina.   Enterocele: Bulging of the top of the vagina, after a hysterectomy (uterus removal), with the small intestine bulging into the vagina. A hernia in the top of the vagina.   Urethrocele: The urethra (urine carrying tube) bulging into the vagina.  TREATMENT  In most cases, prolapse needs to be treated only if it produces symptoms. If the symptoms are interfering with your usual daily or sexual activities, treatment may be necessary. The following are some measures that may be used to treat prolapse.  Estrogen may help elderly women with mild prolapse.   Kegel exercises may help mild cases of prolapse, by strengthening and tightening the muscles of the pelvic floor.   Pessaries are used in women who choose not to, or are unable to, have surgery. A pessary is a doughnut-shaped piece of plastic or rubber that is put into the vagina to keep the organs in place. This device must   be fitted by your caregiver. Your caregiver will also explain how to care for yourself with the pessary. If it works well for you, this may be the only treatment required.   Surgery is often the only form of treatment for more severe prolapses. There are different types of surgery available. You should discuss what the best procedure is for you. If the uterus is prolapsed, it may be removed (hysterectomy) as part of the surgical treatment.  Your caregiver will discuss the risks and benefits with you.   Uterine-vaginal suspension (surgery to hold up the organs) may be used, especially if you want to maintain your fertility.  No form of treatment is guaranteed to correct the prolapse or relieve the symptoms. HOME CARE INSTRUCTIONS   Wear a sanitary pad or absorbent product if you have incontinence of urine.   Avoid heavy lifting and straining with exercise and work.   Take over-the-counter pain medicine for minor discomfort.   Try taking estrogen or using estrogen vaginal cream.   Try Kegel exercises or use a pessary, before deciding to have surgery.   Do Kegel exercises after having a baby.  SEEK MEDICAL CARE IF:   Your symptoms interfere with your daily activities.   You need medicine to help with the discomfort.   You need to be fitted with a pessary.   You notice bleeding from the vagina.   You think you have ulcers or you notice ulcers on the cervix.   You have an oral temperature above 102 F (38.9 C).   You develop pain or blood with urination.   You have bleeding with a bowel movement.   The symptoms are interfering with your sex life.   You have urinary incontinence that interferes with your daily activities.   You lose urine with sexual intercourse.   You have a chronic cough.   You have chronic constipation.  Document Released: 01/20/2003 Document Revised: 03/28/2011 Document Reviewed: 07/31/2009 [employer] Patient Information 2012 [employer]. 

## 2011-09-18 NOTE — ED Notes (Signed)
Pt alert, nad, presents to ED with "mass in vagina", onset this evening, states was having a bowel movement when noticed, no hx of such, states vaginal delivery of three children, denies pain or discomfort

## 2011-09-18 NOTE — ED Provider Notes (Signed)
History     CSN: [id number]  Arrival date & time 09/18/11  0014   First MD Initiated Contact with Patient 09/18/11 0107      Chief Complaint  Patient presents with  . Vaginal Prolapse    (Consider location/radiation/quality/duration/timing/severity/associated sxs/prior treatment) The history is provided by the patient.   patient was at home tonight and went to the use of bathroom and was having a bowel movement noticed mass in her vaginal area. No significant pain. No bleeding. She is able to make urine since this time without difficulty. Patient very anxious and presents here for evaluation. She has had 3 vaginal deliveries. No history of prolapse in the past. No nausea vomiting diarrhea. No hematuria. No fevers or chills. [facility] but does not have an OB/GYN physician. Moderate in severity. No known aggravating or alleviating factors. No visualized mass. No rectal mass. Blood in stools  Past Medical History  Diagnosis Date  . Coronary artery disease     STATUS POST PTCA AND STENTING OF HER RIGHT CORONARY ARTERY  . Hypertension   . Chest pain      consistent with unstable angina  . Tobacco abuse     with possible chronic obstructive pulmonary   disease.  Instructed to follow up with primary care for evaluation   of lung function.     Past Surgical History  Procedure Date  . Tubal ligation   . Cardiac catheterization     ejection fraction of 60%    Family History  Problem Relation Age of Onset  . Heart failure Mother 66  . Hypertension Mother   . Heart failure Father 26  . Diabetes type II Father   . Hypertension Sister   . Heart failure Brother 58    History  Substance Use Topics  . Smoking status: Current Everyday Smoker -- 0.5 packs/day    Types: Cigarettes  . Smokeless tobacco: Not on file  . Alcohol Use: No    OB History    Grav Para Term Preterm Abortions TAB SAB Ect Mult Living                  Review of Systems   Constitutional: Negative for fever and chills.  HENT: Negative for neck pain and neck stiffness.   Eyes: Negative for pain.  Respiratory: Negative for shortness of breath.   Cardiovascular: Negative for chest pain.  Gastrointestinal: Negative for abdominal pain.  Genitourinary: Negative for dysuria, hematuria and pelvic pain.  Musculoskeletal: Negative for back pain.  Skin: Negative for rash.  Neurological: Negative for headaches.  All other systems reviewed and are negative.    Allergies  Penicillins  Home Medications   Current Outpatient Rx  Name Route Sig Dispense Refill  . AMLODIPINE BESYLATE 2.5 MG PO TABS Oral Take 1 tablet (2.5 mg total) by mouth daily. 30 tablet 11  . ASPIRIN 81 MG PO TABS Oral Take 81 mg by mouth daily.      Marland Kitchen CALCIUM + D PO Oral Take 600 mg by mouth daily.      Marland Kitchen CLOPIDOGREL BISULFATE 75 MG PO TABS Oral Take 1 tablet (75 mg total) by mouth daily. 30 tablet 5  . LISINOPRIL 20 MG PO TABS Oral Take 20 mg by mouth daily.      Marland Kitchen METOPROLOL SUCCINATE ER 25 MG PO TB24 Oral Take 1 tablet (25 mg total) by mouth daily. 30 tablet 5  . MULTIVITAMIN PO Oral Take by mouth.      Marland Kitchen  PRAVASTATIN SODIUM 40 MG PO TABS Oral Take 1 tablet (40 mg total) by mouth every evening. 30 tablet 11  . NITROGLYCERIN 0.4 MG SL SUBL Sublingual Place 0.4 mg under the tongue every 5 (five) minutes as needed.        BP 187/64  Pulse 65  Temp(Src) 98.6 F (37 C) (Oral)  Resp 18  SpO2 95%  Physical Exam  Constitutional: She is oriented to person, place, and time. She appears well-developed and well-nourished.  HENT:  Head: Normocephalic and atraumatic.  Eyes: Conjunctivae and EOM are normal. Pupils are equal, round, and reactive to light.  Neck: Trachea normal. Neck supple. No thyromegaly present.  Cardiovascular: Normal rate, regular rhythm, S1 normal, S2 normal and normal pulses.     No systolic murmur is present   No diastolic murmur is present  Pulses:      Radial pulses  are 2+ on the right side, and 2+ on the left side.  Pulmonary/Chest: Effort normal and breath sounds normal. She has no wheezes. She has no rhonchi. She has no rales. She exhibits no tenderness.  Abdominal: Soft. Normal appearance and bowel sounds are normal. There is no tenderness. There is no CVA tenderness and negative Murphy's sign.  Genitourinary: Vagina normal. No vaginal discharge found.       Intravaginal mass suspect vaginal prolapse. No blood. No tenderness.  Musculoskeletal:       BLE:s Calves nontender, no cords or erythema, negative Homans sign  Neurological: She is alert and oriented to person, place, and time. She has normal strength. No cranial nerve deficit or sensory deficit. GCS eye subscore is 4. GCS verbal subscore is 5. GCS motor subscore is 6.  Skin: Skin is warm and dry. No rash noted. She is not diaphoretic.  Psychiatric: Her speech is normal.       Cooperative and appropriate    ED Course  Procedures (including critical care time)    1. Vaginal prolapse       MDM   Presentation and Exam consistent with vaginal prolapse. Patient given GYN referral for outpatient followup. Precautions and further information regarding prolapse provided.         Sunnie Nielsen, MD 09/18/11 [phone]

## 2011-10-01 ENCOUNTER — Telehealth: Payer: Self-pay | Admitting: Cardiovascular Disease

## 2011-10-01 NOTE — Telephone Encounter (Signed)
Pt needs refill of Clopidogrel 75 mg, called into [facility]

## 2011-10-02 MED ORDER — CLOPIDOGREL BISULFATE 75 MG PO TABS: 75.0000 mg | ORAL_TABLET | Freq: Every day | ORAL | Status: AC

## 2011-10-02 NOTE — Telephone Encounter (Signed)
CALLED RX [facility]

## 2011-10-25 ENCOUNTER — Ambulatory Visit (INDEPENDENT_AMBULATORY_CARE_PROVIDER_SITE_OTHER): Payer: Self-pay | Admitting: Advanced Practice Midwife

## 2011-10-25 ENCOUNTER — Encounter: Payer: Self-pay | Admitting: Family Medicine

## 2011-10-25 VITALS — BP 149/70 | HR 61 | Temp 96.8°F | Ht 63.0 in | Wt 162.1 lb

## 2011-10-25 DIAGNOSIS — N8111 Cystocele, midline: Secondary | ICD-10-CM

## 2011-10-25 DIAGNOSIS — IMO0002 Reserved for concepts with insufficient information to code with codable children: Secondary | ICD-10-CM

## 2011-10-25 NOTE — Progress Notes (Signed)
Subjective:    Melanie Mccarty is a 61 y.o. female who presents for evaluation of a cystocele. Problem started gradually over the past few years, but on 09/18/11 she experienced a significant prolapse several inches beyond the introitus and sought evaluated in the ED. Symptoms include: prolapse of tissue with straining without urinary or fecal incontinance or difficulty or pain. Symptoms have gradually worsened.  Menstrual History: OB History    Grav Para Term Preterm Abortions TAB SAB Ect Mult Living                   No LMP recorded. Patient is postmenopausal.    The following portions of the patient's history were reviewed and updated as appropriate: allergies, current medications, past family history, past medical history, past social history, past surgical history and problem list.  Review of Systems Cardiovascular: negative for chest pain, chest pressure/discomfort, dyspnea and palpitations   Objective:     BP 149/70  Pulse 61  Temp(Src) 96.8 F (36 C) (Oral)  Ht 5\' 3"  (1.6 m)  Wt 162 lb 1.6 oz (73.528 kg)  BMI 28.71 kg/m2 Pelvis:  External genitalia: normal general appearance and anterior wall of the vagina slightly visible at introitus w/out straining Vaginal: atrophic mucosa and cystocele present, visible at introitus w.out straining, protruding 3 cm beyond introitus w/ straining. Cervix: normal appearance. Descends to hymenal ring w/ straining w/ speculum in place Uterus: normal, nontender     Assessment:    The patient has a cystocele   Plan:    Discussed cystoceles and management options with the patient. Agricultural engineer distributed. Discussed pessary and will plan visit for fitting w/ Dr. Marice Potter. Will likely need size 6 or 7--ordered Discussed surgical repair PRN if pessary not effective.  Dorathy Kinsman 10/24/11

## 2011-10-25 NOTE — Patient Instructions (Signed)
Cystocele Repair A cystocele is a bulging, drooping hernia or break (rupture) of bladder tissue into the birth canal (vagina). This bulging or rupture occurs on the top front wall of the vagina. CAUSES  Cystocele is associated with weakness of the top front wall of the vagina due to stretching and tearing of the ligaments and muscles in the area. This is often the result of:  Multiple childbirths.   Continuous heavy lifting.   Chronic cough from asthma, emphysema, or smoking.   Being overweight.   Changes from aging.   Previous surgery in the vaginal area.   Menopause with loss of estrogen hormone and weakening of the ligaments and muscles around the bladder.  SYMPTOMS   Uncontrolled loss of urine (incontinence) with cough, sneeze, or exercise.   Pelvic pressure.   Frequency or urgency to urinate because of inability to completely empty the bladder.   Bladder infections.   Needing to push on the upper vagina to help yourself pass urine.  DIAGNOSIS  A cystocele can be diagnosed by doing a pelvic exam and observing the top of the vagina drooping or bulging into or out of the vagina. TREATMENT  Surgical options:  Cystocele repair is surgery that removes the hernia.   There are also different "sling" operations that may be used.  Discuss the different types of surgeries to repair a cystocele with your caregiver. Your caregiver will decide what type of surgery will be best in your case. Nonsurgical options:  Kegel exercises. This helps strengthen and tighten the muscles and tissue in and around the bladder and vagina. This may help with mild cases of cystocele.   A pessary may help the cystocele. A pessary is a plastic or rubber device that lifts the bladder into place. A pessary must be fitted by a doctor.   Tampons or diaphragms that lift the bladder into place are sometimes helpful with a minor or small cystocele.   Estrogen may help with mild cases in menopausal and aging  women.  LET YOUR CAREGIVER KNOW ABOUT:   Allergies to food or medicine.   Medicines taken, including vitamins, herbs, eyedrops, over-the-counter medicines, and creams.   Use of steroids (by mouth or creams).   Previous problems with anesthetics or numbing medicines.   History of bleeding problems or blood clots.   Previous surgery.   Other health problems, including diabetes and kidney problems.   Possibility of pregnancy, if this applies.  RISKS AND COMPLICATIONS  All surgery is associated with risks.  There are risks with a general anesthesia. You should discuss this with your caregiver.   With spinal or epidural anesthesia, there may be an area that is not numbed, and you could feel pain.   Headache could occur with a spinal or epidural anesthetic.   The catheter you will have after surgery may not work properly or may get blocked and need to be replaced.   Excessive bleeding.   Infection.   Injury to surrounding structures.   Recurrence of the cystocele.   Surgery may not get rid of your symptoms.  BEFORE THE PROCEDURE   Do not take aspirin or blood thinners for 1 week prior to surgery, unless instructed otherwise.   Do not eat or drink anything after midnight the night before surgery.   Let your caregiver know if you develop a cold or other infectious problems prior to surgery.   If being admitted the day of surgery, you should be present 1 hour prior to your   procedure or as directed by your caregiver.   Plan and arrange for help when you go home from the hospital.   If you smoke, do not smoke for at least 2 weeks before the surgery.   Do not drink any alcohol for 3 days before the surgery.  PROCEDURE  You will be given an anesthetic to prevent you from feeling pain during surgery. This may be a general anesthetic that puts you to sleep, or a spinal or epidural anesthetic. You will be asleep or be numbed through the entire procedure. During cystocele repair,  tissue is pulled from the sides and around the top of the vagina to lift up the hernia. This removes the hernia so that the top of the vagina does not fall into the opening of the vagina. AFTER THE PROCEDURE  After surgery, you will be taken to the recovery room where a nurse will take care of you, checking your breathing, blood pressure, pulse, and your progress. When your caregiver feels you are stable, you will be taken to your room. You will have a drainage tube (Foley catheter) that will drain your bladder for 2 to 7 days or longer, until your bladder is working properly. This catheter is placed prior to surgery to help keep your bladder empty and out of the way during the procedure. After surgery, this will make passing your urine easier. The catheter will be removed when you can easily pass urine without this assistance. You may have gauze packing in the vagina that will be removed 1 to 2 days after the surgery. Usually, you will be given a medicine (antibiotic) that kills germs. You will be given pain medicine as needed. You can usually go home in 3 to 5 days. HOME CARE INSTRUCTIONS   Do not take baths. Take showers until your caregiver informs you otherwise.   Take antibiotics as directed by your caregiver.   Exercise as instructed. Do not perform exercises which increase the pressure inside your belly (abdomen), such as sit-ups or lifting weights, until your caregiver has given permission. Walking exercise is preferred.   Only take over-the-counter or prescription medicines for pain and discomfort as directed by your caregiver.   Do not drink alcohol while taking pain medicine.   Do not lift anything over 5 pounds.   Do not drive until your caregiver gives you permission.   Get plenty of rest and sleep.   Have someone help with your household chores for 1 to 2 weeks.   If you develop constipation, you may take a mild laxative with your caregiver's permission. Eating bran foods and  drinking enough water and fluids to keep your urine clear or pale yellow helps with constipation.   Do not take aspirin. It may cause bleeding.   You may resume normal diet and unstrenuous activities as directed.   Do not douche, use tampons, or engage in intercourse until your surgeon has given permission.   Change bandages (dressings) as directed.   Make and keep all your postoperative appointments.  SEEK MEDICAL CARE IF:   You have abnormal vaginal discharge.   You develop a rash.   You are having a reaction to your medicine.   You develop nausea or vomiting.  SEEK IMMEDIATE MEDICAL CARE IF:   You have redness, swelling, or increasing pain in the vaginal area.   You notice pus coming from the vagina.   You have a fever.   You notice a bad smell coming from the vagina.     You have increasing abdominal pain.   You have frequent urination or you notice burning during urination.   You notice blood in your urine.   You have excessive vaginal bleeding.   You cannot urinate.  MAKE SURE YOU:   Understand these instructions.   Will watch your condition.   Will get help right away if you are not doing well or get worse.  Document Released: 07/13/2000 Document Revised: 07/05/2011 Document Reviewed: 10/13/2009 [employer] Patient Information 2012 [employer]. 

## 2011-11-12 ENCOUNTER — Telehealth: Payer: Self-pay | Admitting: *Deleted

## 2011-11-12 NOTE — Telephone Encounter (Signed)
Appointment made for 11/23/11 at 0915 with Dr. Marice Potter. Left message with appointment details.

## 2011-11-12 NOTE — Telephone Encounter (Signed)
Pt called stating needs appt for pessary fitting.

## 2011-11-23 ENCOUNTER — Ambulatory Visit (INDEPENDENT_AMBULATORY_CARE_PROVIDER_SITE_OTHER): Payer: Self-pay | Admitting: Obstetrics & Gynecology

## 2011-11-23 VITALS — BP 135/62 | HR 53 | Temp 98.5°F | Ht 62.0 in | Wt 157.2 lb

## 2011-11-23 DIAGNOSIS — N812 Incomplete uterovaginal prolapse: Secondary | ICD-10-CM

## 2011-11-23 MED ORDER — ESTROGENS, CONJUGATED 0.625 MG/GM VA CREA: TOPICAL_CREAM | VAGINAL | Status: DC

## 2011-11-23 NOTE — Progress Notes (Deleted)
  Subjective:    Patient ID: Melanie Mccarty, female    DOB: 1950-09-07, 61 y.o.   MRN: [id number]  HPI  She is now 6 weeks post op status post RATH. Her ovaries were left in situ. Her only complaint is that of worsening hot flashes. She says that she has had hot flashes since her teenage years, but that they are worse now. She feels anxious, but has no physical complaints.  Review of Systems    looking for work Objective:   Physical Exam   Incisions healed great. Cuff healed great Bimanual exam normal     Assessment & Plan:   Post op stable Hot flashes- I will check [facility] today but we discussed the average age of menopause in the [place] as well as her family history of early menopause.

## 2011-11-23 NOTE — Progress Notes (Signed)
Addended by: Allie Bossier on: 11/23/2011 10:31 AM   Modules accepted: Orders

## 2011-11-23 NOTE — Progress Notes (Addendum)
  Subjective:    Patient ID: Melanie Mccarty, female    DOB: 1951-06-10, 61 y.o.   MRN: [id number]  HPI  Melanie Mccarty is here for pessary placement. She has not yet used estrogen cream  Review of Systems 4th cystocele 4th degree uterine prolapse Bimanual exam normal    Objective:   Physical Exam   4th cystocele 4th degree uterine prolapse Bimanual exam normal     Assessment & Plan:  Symptomatic prolapse #6 ring with diaphragm placed and it did resolve her prolapse. She was able to remove and replace it. She understands the importance of using vaginal estrogen to prevent erosion into the bladder/rectum. She will remove it every night and replace it in the AM.

## 2011-12-12 ENCOUNTER — Ambulatory Visit: Payer: Self-pay | Admitting: Cardiovascular Disease

## 2011-12-12 ENCOUNTER — Other Ambulatory Visit: Payer: Self-pay

## 2011-12-20 ENCOUNTER — Ambulatory Visit: Payer: Self-pay | Admitting: Obstetrics & Gynecology

## 2012-05-21 ENCOUNTER — Ambulatory Visit (INDEPENDENT_AMBULATORY_CARE_PROVIDER_SITE_OTHER): Payer: Self-pay | Admitting: Obstetrics & Gynecology

## 2012-05-21 ENCOUNTER — Encounter: Payer: Self-pay | Admitting: Obstetrics & Gynecology

## 2012-05-21 VITALS — BP 162/82 | HR 72 | Temp 98.0°F | Ht 60.0 in | Wt 152.2 lb

## 2012-05-21 DIAGNOSIS — N814 Uterovaginal prolapse, unspecified: Secondary | ICD-10-CM

## 2012-05-21 DIAGNOSIS — N813 Complete uterovaginal prolapse: Secondary | ICD-10-CM

## 2012-05-21 NOTE — Progress Notes (Signed)
Subjective:     Patient ID: Melanie Mccarty, female   DOB: 02/12/1951, 61 y.o.   MRN: [id number]  HPI  Pt has a h/o a complete uterine prolapse.  She has had a pessary in the past but, reports that it irritates her and she wants to proceed with surgical management of her prolapse.  She did not bring her pessary with her to this visit.  Pt denies leakage of urine even with pessary in place.   She has multiple medical problems.    Review of Systems     Objective:   Physical ExamBP 162/82  Pulse 72  Temp 98 F (36.7 C)  Ht 5' (1.524 m)  Wt 152 lb 3.2 oz (69.037 kg)  BMI 29.72 kg/m2  GU: EGBUS: no lesions Vagina: no blood in vault Cervix: no lesion; no mucopurulent d/c Uterus: completely prolapsed.  The bladder has some support. Adnexa: no masses; sl tender         Assessment:     Complete uterine prolapse- will proceed with getting pt cleared for surgical management with [facility]    Plan:     Schedule [facility] Pt needs Cardiac clearance PRIOR to surgery. Needs ov here to review surgery in detail PRIOR to OR Pt to get paperwork to apply for ins needs  Melanie Mccarty, M.D., [facility]

## 2012-05-21 NOTE — Progress Notes (Signed)
Here today because she  Was using the pessary and the estrogen cream, but states the pessary was causing irritation and she stopped using the pessary  Sometime in June, but still using the cream.

## 2012-05-21 NOTE — Patient Instructions (Signed)
Prolapse  Prolapse means the falling down, bulging, dropping, or drooping of a body part. Organs that commonly prolapse include the rectum, small intestine, bladder, urethra, vagina (birth canal), uterus (womb), and cervix. Prolapse occurs when the ligaments and muscle tissue around the rectum, bladder, and uterus are damaged or weakened.  CAUSES  This happens especially with:  Childbirth. Some women feel pelvic pressure or have trouble holding their urine right after childbirth, because of stretching and tearing of pelvic tissues. This generally gets better with time and the feeling usually goes away, but it may return with aging.  Chronic heavy lifting.  Aging.  Menopause, with loss of estrogen production weakening the pelvic ligaments and muscles.  Past pelvic surgery.  Obesity.  Chronic constipation.  Chronic cough. Prolapse may affect a single organ, or several organs may prolapse at the same time. The front wall of the vagina holds up the bladder. The back wall holds up part of the lower intestine, or rectum. The uterus fills a spot in the middle. All these organs can be involved when the ligaments and muscles around the vagina relax too much. This often gets worse when women stop producing estrogen (menopause). SYMPTOMS  Uncontrolled loss of urine (incontinence) with cough, sneeze, straining, and exercise.  More force may be required to have a bowel movement, due to trapping of the stool.  When part of an organ bulges through the opening of the vagina, there is sometimes a feeling of heaviness or pressure. It may feel as though something is falling out. This sensation increases with coughing or bearing down.  If the organs protrude through the opening of the vagina and rub against the clothing, there may be soreness, ulcers, infection, pain, and bleeding.  Lower back pain.  Pushing in the upper or lower part of the vagina, to pass urine or have a bowel movement.  Problems  having sexual intercourse.  Being unable to insert a tampon or applicator. DIAGNOSIS  Usually, a physical exam is all that is needed to identify the problem. During the examination, you may be asked to cough and strain while lying down, sitting up, and standing up. Your caregiver will determine if more testing is required, such as bladder function tests. Some diagnoses are:  Cystocele: Bulging and falling of the bladder into the top of the vagina.  Rectocele: Part of the rectum bulging into the vagina.  Prolapse of the uterus: The uterus falls or drops into the vagina.  Enterocele: Bulging of the top of the vagina, after a hysterectomy (uterus removal), with the small intestine bulging into the vagina. A hernia in the top of the vagina.  Urethrocele: The urethra (urine carrying tube) bulging into the vagina. TREATMENT  In most cases, prolapse needs to be treated only if it produces symptoms. If the symptoms are interfering with your usual daily or sexual activities, treatment may be necessary. The following are some measures that may be used to treat prolapse.  Estrogen may help elderly women with mild prolapse.  Kegel exercises may help mild cases of prolapse, by strengthening and tightening the muscles of the pelvic floor.  Pessaries are used in women who choose not to, or are unable to, have surgery. A pessary is a doughnut-shaped piece of plastic or rubber that is put into the vagina to keep the organs in place. This device must be fitted by your caregiver. Your caregiver will also explain how to care for yourself with the pessary. If it works well for you,   to keep the organs in place. This device must be fitted by your caregiver. Your caregiver will also explain how to care for yourself with the pessary. If it works well for you, this may be the only treatment required.   Surgery is often the only form of treatment for more severe prolapses. There are different types of surgery available. You should discuss what the best procedure is for you. If the uterus is prolapsed, it may be removed (hysterectomy) as part of the surgical treatment. Your caregiver will  discuss the risks and benefits with you.   Uterine-vaginal suspension (surgery to hold up the organs) may be used, especially if you want to maintain your fertility.  No form of treatment is guaranteed to correct the prolapse or relieve the symptoms.  HOME CARE INSTRUCTIONS    Wear a sanitary pad or absorbent product if you have incontinence of urine.   Avoid heavy lifting and straining with exercise and work.   Take over-the-counter pain medicine for minor discomfort.   Try taking estrogen or using estrogen vaginal cream.   Try Kegel exercises or use a pessary, before deciding to have surgery.   Do Kegel exercises after having a baby.  SEEK MEDICAL CARE IF:    Your symptoms interfere with your daily activities.   You need medicine to help with the discomfort.   You need to be fitted with a pessary.   You notice bleeding from the vagina.   You think you have ulcers or you notice ulcers on the cervix.   You have an oral temperature above 102 F (38.9 C).   You develop pain or blood with urination.   You have bleeding with a bowel movement.   The symptoms are interfering with your sex life.   You have urinary incontinence that interferes with your daily activities.   You lose urine with sexual intercourse.   You have a chronic cough.   You have chronic constipation.  Document Released: 01/20/2003 Document Revised: 10/08/2011 Document Reviewed: 07/31/2009  Exit[employer] Patient Information 2013 [employer].

## 2012-05-22 ENCOUNTER — Encounter: Payer: Self-pay | Admitting: *Deleted

## 2012-05-23 ENCOUNTER — Telehealth: Payer: Self-pay | Admitting: Cardiovascular Disease

## 2012-05-23 NOTE — Telephone Encounter (Signed)
New Problem:    Patient called in needing a surgical clearance faxed to [facility] for a procedure to repair a prolapsed uterus.  Please call back.

## 2012-05-23 NOTE — Telephone Encounter (Signed)
App made 

## 2012-05-28 ENCOUNTER — Telehealth: Payer: Self-pay | Admitting: *Deleted

## 2012-05-28 NOTE — Telephone Encounter (Signed)
Pt called to state that she has appt w/cardiologist on 11/20 for surgical clearance. She is waiting to get surgery scheduled for her prolapsed uterus. She has financial paperwork completed and wants to know if she should bring now or wait until after the cardiologist appt.  I returned pt's call this morning and advised her to bring the paperwork to our registration staff ASAP.  She also needs appt w/Dr. Erin Fulling after she sees the cardiologist to discuss the details of her surgery.  Someone from our office will call her with appt details.  Pt voiced understanding.

## 2012-06-18 ENCOUNTER — Ambulatory Visit (INDEPENDENT_AMBULATORY_CARE_PROVIDER_SITE_OTHER): Payer: Self-pay | Admitting: Cardiovascular Disease

## 2012-06-18 ENCOUNTER — Encounter: Payer: Self-pay | Admitting: Cardiovascular Disease

## 2012-06-18 VITALS — BP 148/70 | HR 52 | Ht 60.0 in | Wt 151.4 lb

## 2012-06-18 DIAGNOSIS — R1900 Intra-abdominal and pelvic swelling, mass and lump, unspecified site: Secondary | ICD-10-CM

## 2012-06-18 DIAGNOSIS — I2510 Atherosclerotic heart disease of native coronary artery without angina pectoris: Secondary | ICD-10-CM

## 2012-06-18 DIAGNOSIS — E785 Hyperlipidemia, unspecified: Secondary | ICD-10-CM

## 2012-06-18 LAB — LIPID PANEL
HDL: 31.7 mg/dL — ABNORMAL LOW (ref >39.00)
LDL Cholesterol: 120 mg/dL — ABNORMAL HIGH (ref 0–99)
Total CHOL/HDL Ratio: 6
Triglycerides: 192 mg/dL — ABNORMAL HIGH (ref 0.0–149.0)

## 2012-06-18 LAB — BASIC METABOLIC PANEL
CO2: 28 mEq/L (ref 19–32)
Chloride: 104 mEq/L (ref 96–112)
Glucose, Bld: 86 mg/dL (ref 70–99)
Sodium: 139 mEq/L (ref 135–145)

## 2012-06-18 LAB — HEPATIC FUNCTION PANEL: Albumin: 4 g/dL (ref 3.5–5.2)

## 2012-06-18 NOTE — Progress Notes (Signed)
Angie Fava Date of Birth  1950-08-16 [facility] [address]    [address] [place], [place]  [address] [phone]  Fax  [phone]   Problem List: 1. CAD - s/p PCI of RCA 2. Hypertension  3. Hyperlipidemia  History of Present Illness:  Mrs. Amerson is a 61 year old female with a history of coronary artery disease. She status post PTCA and stenting of her right coronary artery using a 2.5 x 32 mm drug-eluting stent. She also has a history of hypertension.  [name] done very well since I last saw her in January. [name] not had any episodes of chest pain or shortness of breath.  Nov. 20, 2013- She is busy at work ( [employer]).  Not a lot of aerobic activity.  Still smoking 1/2 ppd.  No angina.  She needs to have GYN surgery-for prolapsed uterine.  Current Outpatient Prescriptions on File Prior to Visit  Medication Sig Dispense Refill  . amLODipine (NORVASC) 2.5 MG tablet Take 2.5 mg by mouth daily.      Marland Kitchen aspirin 81 MG tablet Take 81 mg by mouth daily.        . Calcium Carbonate-Vitamin D (CALCIUM + D PO) Take 600 mg by mouth daily.        . clopidogrel (PLAVIX) 75 MG tablet Take 1 tablet (75 mg total) by mouth daily.  30 tablet  11  . conjugated estrogens (PREMARIN) vaginal cream 1 gram per vagina 2 nights per week  42.5 g  12  . fish oil-omega-3 fatty acids 1000 MG capsule Take 2 g by mouth daily.      Marland Kitchen lisinopril (PRINIVIL,ZESTRIL) 20 MG tablet Take 20 mg by mouth daily.        . metoprolol succinate (TOPROL-XL) 25 MG 24 hr tablet Take 1 tablet (25 mg total) by mouth daily.  30 tablet  5  . Multiple Vitamin (MULTIVITAMIN PO) Take by mouth.        . nitroGLYCERIN (NITROSTAT) 0.4 MG SL tablet Place 0.4 mg under the tongue every 5 (five) minutes as needed.        Marland Kitchen VITAMIN D, CHOLECALCIFEROL, PO Take 400 Units by mouth daily.      . [DISCONTINUED] amLODipine (NORVASC) 2.5 MG tablet Take 1 tablet (2.5 mg total) by mouth daily.  30 tablet  11     Allergies  Allergen Reactions  . Penicillins     Past Medical History  Diagnosis Date  . Coronary artery disease     STATUS POST PTCA AND STENTING OF HER RIGHT CORONARY ARTERY  . Hypertension   . Chest pain      consistent with unstable angina  . Tobacco abuse     with possible chronic obstructive pulmonary   disease.  Instructed to follow up with primary care for evaluation   of lung function.     Past Surgical History  Procedure Date  . Tubal ligation   . Cardiac catheterization     ejection fraction of 60%    History  Smoking status  . Current Every Day Smoker -- 0.5 packs/day  . Types: Cigarettes  Smokeless tobacco  . Never Used    History  Alcohol Use No    Family History  Problem Relation Age of Onset  . Heart failure Mother 46  . Hypertension Mother   . Heart failure Father 13  . Diabetes type II Father   . Hypertension Sister   . Heart failure  Brother 48    Reviw of Systems:  Reviewed in the HPI.  All other systems are negative.  Physical Exam: BP 148/70  Pulse 52  Ht 5' (1.524 m)  Wt 151 lb 6.4 oz (68.675 kg)  BMI 29.57 kg/m2 The patient is alert and oriented x 3.  The mood and affect are normal.   Skin: warm and dry.  Color is normal.    HEENT:   Normocephalic/atraumatic. She has a tender no bruits.  Lungs: Clear to auscultation.   Heart: Regular rate S1-S2.    Abdomen: +BS, + pulsitile midline abdominal aorta.  Extremities:  No c/c/e  Neuro:  Non focal    ECG: 06/20/2012 sinus brady at 52, NS ST T abn. In inferior lateral leads - no changes from previous tracing   Assessment / Plan:

## 2012-06-18 NOTE — Assessment & Plan Note (Signed)
Melanie Mccarty presents for followup evaluation. This is routine followup for her. I noted a pulsatile midline mass in her abdomen. I suspect she may have an abdominal aortic aneurysm. She is a long-term cigarette smoker and has a history of coronary artery disease. We'll get an abdominal aortic duplex and to evaluate her for an abdominal aortic aneurysm.  This will not affect her ability to have her GYN surgery in the next several weeks.

## 2012-06-18 NOTE — Patient Instructions (Addendum)
Your physician has requested that you have an abdominal aorta duplex. During this test, an ultrasound is used to evaluate the aorta. Allow 30 minutes for this exam. Do not eat after midnight the day before and avoid carbonated beverages  Your physician recommends that you return for a FASTING lipid profile: today  Your physician wants you to follow-up in: 1 year You will receive a reminder letter in the mail two months in advance. If you don't receive a letter, please call our office to schedule the follow-up appointment.  You may hold plavix 5 days prior to procedure but continue aspirin.

## 2012-06-18 NOTE — Assessment & Plan Note (Signed)
Melanie Mccarty remains stable. [name] not had any episodes of angina. We performed PTCA and stenting of her right coronary artery 2 years ago. Because she's been so stable, I do not think that she needs a stress test prior to her GYN surgery.  She may hold her Plavix for 5 days prior to her surgery. She should restart her Plavix the day following surgery. She should not discontinue her aspirin.  We'll check fasting lipid profile, hepatic profile, and basic metabolic profile on her today. I'll see her again in one year for office visit, fasting labs, and EKG.

## 2012-06-19 ENCOUNTER — Telehealth: Payer: Self-pay | Admitting: *Deleted

## 2012-06-19 DIAGNOSIS — E785 Hyperlipidemia, unspecified: Secondary | ICD-10-CM

## 2012-06-19 MED ORDER — ATORVASTATIN CALCIUM 40 MG PO TABS: 40.0000 mg | ORAL_TABLET | Freq: Every day | ORAL | Status: DC

## 2012-06-19 NOTE — Telephone Encounter (Signed)
Pt agreed to change cholesterol medications, 3 month lab date set.

## 2012-06-19 NOTE — Telephone Encounter (Signed)
Message copied by Antony Odea on Thu Jun 19, 2012  3:52 PM ------      Message from: [place], [place] J      Created: Wed Jun 18, 2012  6:27 PM       The pravastatin is not strong enough.  Ask her to take atorvastatin 40 qD instead. Check labs in 3 months.

## 2012-06-20 ENCOUNTER — Encounter: Payer: Self-pay | Admitting: Obstetrics & Gynecology

## 2012-06-20 ENCOUNTER — Ambulatory Visit (INDEPENDENT_AMBULATORY_CARE_PROVIDER_SITE_OTHER): Payer: Self-pay | Admitting: Obstetrics & Gynecology

## 2012-06-20 ENCOUNTER — Telehealth: Payer: Self-pay | Admitting: Cardiovascular Disease

## 2012-06-20 VITALS — BP 148/67 | HR 62 | Temp 97.2°F | Ht 62.0 in | Wt 153.3 lb

## 2012-06-20 DIAGNOSIS — N814 Uterovaginal prolapse, unspecified: Secondary | ICD-10-CM

## 2012-06-20 DIAGNOSIS — N813 Complete uterovaginal prolapse: Secondary | ICD-10-CM

## 2012-06-20 MED ORDER — ATORVASTATIN CALCIUM 40 MG PO TABS: 40.0000 mg | ORAL_TABLET | Freq: Every day | ORAL | Status: AC

## 2012-06-20 NOTE — Progress Notes (Signed)
Spoke with patient regarding status of application.  Explained her application would be processed today.  Patient states she needs to leave to get to work.  Asks if I will call Asher Muir at [phone] (her family member) and leave a message once I find out her discount.  Phoned Jamie at 11:02 am.  Asked if Asher Muir would give the information to Ms. Lanagan that her application has been processed and she has 100% discount.

## 2012-06-20 NOTE — Progress Notes (Signed)
Patient ID: Melanie Mccarty, female   DOB: 1951/04/19, 61 y.o.   MRN: [id number] This is a pt with a h/o complete uterine prolapse who is to be scheduled for [facility].  Pt has gone to her primary care physician and has surgical clearance with instructions on the chart.  Pt met briefly with Mayra Neer.  All of her paperwork has been forwarded on and she will be contacted with a surgery date and time.  Pt had to leave to get to work.  Ladaysha Soutar L. Harraway-Smith, M.D., [facility]

## 2012-06-20 NOTE — Patient Instructions (Addendum)
Prolapse  Prolapse means the falling down, bulging, dropping, or drooping of a body part. Organs that commonly prolapse include the rectum, small intestine, bladder, urethra, vagina (birth canal), uterus (womb), and cervix. Prolapse occurs when the ligaments and muscle tissue around the rectum, bladder, and uterus are damaged or weakened.  CAUSES  This happens especially with:  Childbirth. Some women feel pelvic pressure or have trouble holding their urine right after childbirth, because of stretching and tearing of pelvic tissues. This generally gets better with time and the feeling usually goes away, but it may return with aging.  Chronic heavy lifting.  Aging.  Menopause, with loss of estrogen production weakening the pelvic ligaments and muscles.  Past pelvic surgery.  Obesity.  Chronic constipation.  Chronic cough. Prolapse may affect a single organ, or several organs may prolapse at the same time. The front wall of the vagina holds up the bladder. The back wall holds up part of the lower intestine, or rectum. The uterus fills a spot in the middle. All these organs can be involved when the ligaments and muscles around the vagina relax too much. This often gets worse when women stop producing estrogen (menopause). SYMPTOMS  Uncontrolled loss of urine (incontinence) with cough, sneeze, straining, and exercise.  More force may be required to have a bowel movement, due to trapping of the stool.  When part of an organ bulges through the opening of the vagina, there is sometimes a feeling of heaviness or pressure. It may feel as though something is falling out. This sensation increases with coughing or bearing down.  If the organs protrude through the opening of the vagina and rub against the clothing, there may be soreness, ulcers, infection, pain, and bleeding.  Lower back pain.  Pushing in the upper or lower part of the vagina, to pass urine or have a bowel movement.  Problems  having sexual intercourse.  Being unable to insert a tampon or applicator. DIAGNOSIS  Usually, a physical exam is all that is needed to identify the problem. During the examination, you may be asked to cough and strain while lying down, sitting up, and standing up. Your caregiver will determine if more testing is required, such as bladder function tests. Some diagnoses are:  Cystocele: Bulging and falling of the bladder into the top of the vagina.  Rectocele: Part of the rectum bulging into the vagina.  Prolapse of the uterus: The uterus falls or drops into the vagina.  Enterocele: Bulging of the top of the vagina, after a hysterectomy (uterus removal), with the small intestine bulging into the vagina. A hernia in the top of the vagina.  Urethrocele: The urethra (urine carrying tube) bulging into the vagina. TREATMENT  In most cases, prolapse needs to be treated only if it produces symptoms. If the symptoms are interfering with your usual daily or sexual activities, treatment may be necessary. The following are some measures that may be used to treat prolapse.  Estrogen may help elderly women with mild prolapse.  Kegel exercises may help mild cases of prolapse, by strengthening and tightening the muscles of the pelvic floor.  Pessaries are used in women who choose not to, or are unable to, have surgery. A pessary is a doughnut-shaped piece of plastic or rubber that is put into the vagina to keep the organs in place. This device must be fitted by your caregiver. Your caregiver will also explain how to care for yourself with the pessary. If it works well for you,   to keep the organs in place. This device must be fitted by your caregiver. Your caregiver will also explain how to care for yourself with the pessary. If it works well for you, this may be the only treatment required.   Surgery is often the only form of treatment for more severe prolapses. There are different types of surgery available. You should discuss what the best procedure is for you. If the uterus is prolapsed, it may be removed (hysterectomy) as part of the surgical treatment. Your caregiver will  discuss the risks and benefits with you.   Uterine-vaginal suspension (surgery to hold up the organs) may be used, especially if you want to maintain your fertility.  No form of treatment is guaranteed to correct the prolapse or relieve the symptoms.  HOME CARE INSTRUCTIONS    Wear a sanitary pad or absorbent product if you have incontinence of urine.   Avoid heavy lifting and straining with exercise and work.   Take over-the-counter pain medicine for minor discomfort.   Try taking estrogen or using estrogen vaginal cream.   Try Kegel exercises or use a pessary, before deciding to have surgery.   Do Kegel exercises after having a baby.  SEEK MEDICAL CARE IF:    Your symptoms interfere with your daily activities.   You need medicine to help with the discomfort.   You need to be fitted with a pessary.   You notice bleeding from the vagina.   You think you have ulcers or you notice ulcers on the cervix.   You have an oral temperature above 102 F (38.9 C).   You develop pain or blood with urination.   You have bleeding with a bowel movement.   The symptoms are interfering with your sex life.   You have urinary incontinence that interferes with your daily activities.   You lose urine with sexual intercourse.   You have a chronic cough.   You have chronic constipation.  Document Released: 01/20/2003 Document Revised: 10/08/2011 Document Reviewed: 07/31/2009  Exit[employer] Patient Information 2013 [employer].

## 2012-06-20 NOTE — Telephone Encounter (Signed)
plz return call to pt [phone] regarding questions about medication

## 2012-06-20 NOTE — Telephone Encounter (Signed)
Spoke with pt, pt was changed from pravastatin to lipitor and the pharm does not have the script. Script called into pharm.

## 2012-07-01 ENCOUNTER — Encounter: Payer: Self-pay | Admitting: Obstetrics & Gynecology

## 2012-07-16 ENCOUNTER — Encounter (INDEPENDENT_AMBULATORY_CARE_PROVIDER_SITE_OTHER): Payer: Self-pay

## 2012-07-16 ENCOUNTER — Ambulatory Visit: Payer: Self-pay | Admitting: Obstetrics & Gynecology

## 2012-07-16 DIAGNOSIS — R1900 Intra-abdominal and pelvic swelling, mass and lump, unspecified site: Secondary | ICD-10-CM

## 2012-07-16 DIAGNOSIS — I2510 Atherosclerotic heart disease of native coronary artery without angina pectoris: Secondary | ICD-10-CM

## 2012-07-16 DIAGNOSIS — R0989 Other specified symptoms and signs involving the circulatory and respiratory systems: Secondary | ICD-10-CM

## 2012-07-16 DIAGNOSIS — E785 Hyperlipidemia, unspecified: Secondary | ICD-10-CM

## 2012-08-07 ENCOUNTER — Ambulatory Visit: Payer: Self-pay | Admitting: Obstetrics & Gynecology

## 2012-08-07 ENCOUNTER — Encounter (HOSPITAL_COMMUNITY): Payer: Self-pay | Admitting: Pharmacist

## 2012-08-11 ENCOUNTER — Ambulatory Visit: Payer: Self-pay | Admitting: Obstetrics & Gynecology

## 2012-08-13 ENCOUNTER — Encounter: Payer: Self-pay | Admitting: Obstetrics & Gynecology

## 2012-08-13 ENCOUNTER — Ambulatory Visit (INDEPENDENT_AMBULATORY_CARE_PROVIDER_SITE_OTHER): Payer: No Typology Code available for payment source | Admitting: Obstetrics & Gynecology

## 2012-08-13 VITALS — BP 143/65 | HR 57 | Temp 97.1°F | Ht 62.0 in | Wt 152.3 lb

## 2012-08-13 DIAGNOSIS — N813 Complete uterovaginal prolapse: Secondary | ICD-10-CM

## 2012-08-13 DIAGNOSIS — N814 Uterovaginal prolapse, unspecified: Secondary | ICD-10-CM

## 2012-08-13 NOTE — Patient Instructions (Addendum)
Hysterectomy Information   A hysterectomy is a procedure where your uterus is surgically removed. It will no longer be possible to have menstrual periods or to become pregnant. The tubes and ovaries can be removed (bilateral salpingo-oopherectomy) during this surgery as well.    REASONS FOR A HYSTERECTOMY  · Persistent, abnormal bleeding.  · Lasting (chronic) pelvic pain or infection.  · The lining of the uterus (endometrium) starts growing outside the uterus (endometriosis).  · The endometrium starts growing in the muscle of the uterus (adenomyosis).  · The uterus falls down into the vagina (pelvic organ prolapse).  · Symptomatic uterine fibroids.  · Precancerous cells.  · Cervical cancer or uterine cancer.  TYPES OF HYSTERECTOMIES  · Supracervical hysterectomy. This type removes the top part of the uterus, but not the cervix.  · Total hysterectomy. This type removes the uterus and cervix.  · Radical hysterectomy. This type removes the uterus, cervix, and the fibrous tissue that holds the uterus in place in the pelvis (parametrium).  WAYS A HYSTERECTOMY CAN BE PERFORMED  · Abdominal hysterectomy. A large surgical cut (incision) is made in the abdomen. The uterus is removed through this incision.  · Vaginal hysterectomy. An incision is made in the vagina. The uterus is removed through this incision. There are no abdominal incisions.  · Conventional laparoscopic hysterectomy. A thin, lighted tube with a camera (laparoscope) is inserted into 3 or 4 small incisions in the abdomen. The uterus is cut into small pieces. The small pieces are removed through the incisions, or they are removed through the vagina.  · Laparoscopic assisted vaginal hysterectomy (LAVH). Three or four small incisions are made in the abdomen. Part of the surgery is performed laparoscopically and part vaginally. The uterus is removed through the vagina.  · Robot-assisted laparoscopic hysterectomy. A laparoscope is inserted into 3 or 4 small  incisions in the abdomen. A computer-controlled device is used to give the surgeon a 3D image. This allows for more precise movements of surgical instruments. The uterus is cut into small pieces and removed through the incisions or removed through the vagina.  RISKS OF HYSTERECTOMY    · Bleeding and risk of blood transfusion. Tell your caregiver if you do not want to receive any blood products.  · Blood clots in the legs or lung.  · Infection.  · Injury to surrounding organs.  · Anesthesia problems or side effects.  · Conversion to an abdominal hysterectomy.  WHAT TO EXPECT AFTER A HYSTERECTOMY  · You will be given pain medicine.  · You will need to have someone with you for the first 3 to 5 days after you go home.  · You will need to follow up with your surgeon in 2 to 4 weeks after surgery to evaluate your progress.  · You may have early menopause symptoms like hot flashes, night sweats, and insomnia.  · If you had a hysterectomy for a problem that was not a cancer or a condition that could lead to cancer, then you no longer need Pap tests. However, even if you no longer need a Pap test, a regular exam is a good idea to make sure no other problems are starting.  Document Released: 01/09/2001 Document Revised: 10/08/2011 Document Reviewed: 02/24/2011  ExitCare® Patient Information ©2013 [employer].

## 2012-08-13 NOTE — Progress Notes (Signed)
Patient ID: Melanie Mccarty, female   DOB: 1951/01/29, 62 y.o.   MRN: [id number] Patient desires surgical management with [facility] with BSO.  The risks of surgery were discussed in detail with the patient including but not limited to: bleeding which may require transfusion or reoperation; infection which may require prolonged hospitalization or re-hospitalization and antibiotic therapy; injury to bowel, bladder, ureters and major vessels or other surrounding organs; need for additional procedures including laparotomy; thromboembolic phenomenon, incisional problems and other postoperative or anesthesia complications.  Patient was told that the likelihood that her condition and symptoms will be treated effectively with this surgical management was very high; the postoperative expectations were also discussed in detail. The patient also understands the alternative treatment options which were discussed in full. All questions were answered.  She was told that she will be contacted by our surgical scheduler regarding the time and date of her surgery; routine preoperative instructions of having nothing to eat or drink after midnight on the day prior to surgery and also coming to the hospital 1 1/2 hours prior to her time of surgery were also emphasized.  She was told she may be called for a preoperative appointment about a week prior to surgery and will be given further preoperative instructions at that visit. Printed patient education handouts about the procedure were given to the patient to review at home.

## 2012-08-14 ENCOUNTER — Encounter (HOSPITAL_COMMUNITY)
Admission: RE | Admit: 2012-08-14 | Discharge: 2012-08-14 | Disposition: A | Discharge status: Home / Self Care | Payer: Self-pay | Source: Ambulatory Visit | Attending: Obstetrics & Gynecology | Admitting: Obstetrics & Gynecology

## 2012-08-14 ENCOUNTER — Encounter (HOSPITAL_COMMUNITY): Payer: Self-pay

## 2012-08-14 HISTORY — DX: Inflammatory liver disease, unspecified: K75.9

## 2012-08-14 HISTORY — DX: Hyperlipidemia, unspecified: E78.5

## 2012-08-14 LAB — SURGICAL PCR SCREEN: Staphylococcus aureus: POSITIVE — AB

## 2012-08-14 LAB — CBC
HCT: 41.8 % (ref 36.0–46.0)
Hemoglobin: 13.9 g/dL (ref 12.0–15.0)
MCV: 97.4 fL (ref 78.0–100.0)
WBC: 7.1 10*3/uL (ref 4.0–10.5)

## 2012-08-14 LAB — BASIC METABOLIC PANEL
BUN: 16 mg/dL (ref 6–23)
Chloride: 102 mEq/L (ref 96–112)
Creatinine, Ser: 0.5 mg/dL (ref 0.50–1.10)
Glucose, Bld: 86 mg/dL (ref 70–99)
Potassium: 4.1 mEq/L (ref 3.5–5.1)

## 2012-08-14 NOTE — Pre-Procedure Instructions (Signed)
Reviewed patient's medical history, ekg and meds with Dr Brayton Caves.  Ok to see patient DOS.

## 2012-08-14 NOTE — Patient Instructions (Addendum)
   Your procedure is scheduled on: Thursday, Jan 23  Enter through the Main Entrance of [facility] at: 8 am Pick up the phone at the desk and dial [phone] and inform us of your arrival.  Please call this number if you have any problems the morning of surgery: [phone]  Remember: Do not eat food after midnight:  Wednesday Do not drink clear liquids after: midnight Wednesday Take these medicines the morning of surgery with a SIP OF WATER:  Norvasc, lipitor, lisinopril, toprol xl.  Patient to withhold plavix___ days prior to surgery  Do not wear jewelry, make-up, or FINGER nail polish No metal in your hair or on your body. Do not wear lotions, powders, perfumes. You may wear deodorant.  Please use your CHG wash as directed prior to surgery.  Do not shave anywhere for at least 12 hours prior to first CHG shower.  Do not bring valuables to the hospital. Contacts, dentures or bridgework may not be worn into surgery.  Leave suitcase in the car. After Surgery it may be brought to your room. For patients being admitted to the hospital, checkout time is 11:00am the day of discharge.  .  Patients discharged on the day of surgery will not be allowed to drive home.  Home with son Bethann Berkshire or daughter Irving Burton.

## 2012-08-20 MED ORDER — GENTAMICIN SULFATE 40 MG/ML IJ SOLN
INTRAVENOUS | Status: AC
  Administered 2012-08-21: 113 mL via INTRAVENOUS
  Filled 2012-08-20: qty 7.13

## 2012-08-20 MED ORDER — GENTAMICIN SULFATE 40 MG/ML IJ SOLN: INTRAVENOUS | Status: DC

## 2012-08-21 ENCOUNTER — Encounter (HOSPITAL_COMMUNITY): Payer: Self-pay | Admitting: Anesthesiology

## 2012-08-21 ENCOUNTER — Inpatient Hospital Stay (HOSPITAL_COMMUNITY): Payer: Self-pay | Admitting: Anesthesiology

## 2012-08-21 ENCOUNTER — Observation Stay (HOSPITAL_COMMUNITY): Payer: Self-pay | Admitting: Anesthesiology

## 2012-08-21 ENCOUNTER — Observation Stay (HOSPITAL_COMMUNITY)
Admission: RE | Admit: 2012-08-21 | Discharge: 2012-08-22 | Disposition: A | Discharge status: Home / Self Care | Payer: Self-pay | Source: Ambulatory Visit | Attending: Obstetrics & Gynecology | Admitting: Obstetrics & Gynecology

## 2012-08-21 ENCOUNTER — Encounter (HOSPITAL_COMMUNITY): Payer: Self-pay | Admitting: Obstetrics & Gynecology

## 2012-08-21 ENCOUNTER — Encounter (HOSPITAL_COMMUNITY)
Admission: RE | Disposition: A | Discharge status: Home / Self Care | Payer: Self-pay | Source: Ambulatory Visit | Attending: Obstetrics & Gynecology

## 2012-08-21 ENCOUNTER — Encounter (HOSPITAL_COMMUNITY): Payer: Self-pay | Admitting: *Deleted

## 2012-08-21 ENCOUNTER — Encounter (HOSPITAL_COMMUNITY): Payer: Self-pay

## 2012-08-21 DIAGNOSIS — IMO0002 Reserved for concepts with insufficient information to code with codable children: Secondary | ICD-10-CM | POA: Insufficient documentation

## 2012-08-21 DIAGNOSIS — I10 Essential (primary) hypertension: Secondary | ICD-10-CM | POA: Insufficient documentation

## 2012-08-21 DIAGNOSIS — N814 Uterovaginal prolapse, unspecified: Principal | ICD-10-CM

## 2012-08-21 DIAGNOSIS — I2510 Atherosclerotic heart disease of native coronary artery without angina pectoris: Secondary | ICD-10-CM | POA: Insufficient documentation

## 2012-08-21 HISTORY — DX: Uterovaginal prolapse, unspecified: N81.4

## 2012-08-21 HISTORY — PX: REPAIR VAGINAL CUFF: SHX6067

## 2012-08-21 HISTORY — PX: VAGINAL HYSTERECTOMY: SHX2639

## 2012-08-21 LAB — CBC
MCH: 31.8 pg (ref 26.0–34.0)
MCV: 97.3 fL (ref 78.0–100.0)
Platelets: 207 10*3/uL (ref 150–400)
RBC: 4.09 MIL/uL (ref 3.87–5.11)
RDW: 12.8 % (ref 11.5–15.5)

## 2012-08-21 LAB — TYPE AND SCREEN: Antibody Screen: NEGATIVE

## 2012-08-21 LAB — ABO/RH: ABO/RH(D): O POS

## 2012-08-21 SURGERY — REPAIR, VAGINAL CUFF
Anesthesia: General | Site: Vagina | Wound class: Clean Contaminated

## 2012-08-21 SURGERY — HYSTERECTOMY, VAGINAL
Anesthesia: General | Site: Vagina | Wound class: Clean Contaminated

## 2012-08-21 MED ORDER — PROPOFOL 10 MG/ML IV BOLUS
INTRAVENOUS | Status: DC | PRN
  Administered 2012-08-21: 150 mg via INTRAVENOUS

## 2012-08-21 MED ORDER — DEXAMETHASONE SODIUM PHOSPHATE 4 MG/ML IJ SOLN
INTRAMUSCULAR | Status: DC | PRN
  Administered 2012-08-21: 10 mg via INTRAVENOUS

## 2012-08-21 MED ORDER — PHENYLEPHRINE 40 MCG/ML (10ML) SYRINGE FOR IV PUSH (FOR BLOOD PRESSURE SUPPORT)
PREFILLED_SYRINGE | INTRAVENOUS | Status: AC
  Filled 2012-08-21: qty 10

## 2012-08-21 MED ORDER — LIDOCAINE HCL (CARDIAC) 20 MG/ML IV SOLN
INTRAVENOUS | Status: DC | PRN
  Administered 2012-08-21: 60 mg via INTRAVENOUS

## 2012-08-21 MED ORDER — 0.9 % SODIUM CHLORIDE (POUR BTL) OPTIME
TOPICAL | Status: DC | PRN
  Administered 2012-08-21: 1000 mL

## 2012-08-21 MED ORDER — PROMETHAZINE HCL 25 MG/ML IJ SOLN: 6.2500 mg | INTRAMUSCULAR | Status: DC | PRN

## 2012-08-21 MED ORDER — ZOLPIDEM TARTRATE 5 MG PO TABS: 5.0000 mg | ORAL_TABLET | Freq: Every evening | ORAL | Status: DC | PRN

## 2012-08-21 MED ORDER — KETOROLAC TROMETHAMINE 30 MG/ML IJ SOLN: 30.0000 mg | Freq: Three times a day (TID) | INTRAMUSCULAR | Status: DC

## 2012-08-21 MED ORDER — MEPERIDINE HCL 25 MG/ML IJ SOLN: 6.2500 mg | INTRAMUSCULAR | Status: DC | PRN

## 2012-08-21 MED ORDER — SENNOSIDES-DOCUSATE SODIUM 8.6-50 MG PO TABS: 1.0000 | ORAL_TABLET | Freq: Every evening | ORAL | Status: DC | PRN

## 2012-08-21 MED ORDER — SODIUM CHLORIDE 0.9 % IJ SOLN
INTRAMUSCULAR | Status: DC | PRN
  Administered 2012-08-21: 50 mL via INTRAVENOUS

## 2012-08-21 MED ORDER — ONDANSETRON HCL 4 MG PO TABS: 4.0000 mg | ORAL_TABLET | Freq: Four times a day (QID) | ORAL | Status: DC | PRN

## 2012-08-21 MED ORDER — OXYCODONE-ACETAMINOPHEN 5-325 MG PO TABS: 1.0000 | ORAL_TABLET | ORAL | Status: DC | PRN

## 2012-08-21 MED ORDER — LACTATED RINGERS IV SOLN: INTRAVENOUS | Status: DC

## 2012-08-21 MED ORDER — KETOROLAC TROMETHAMINE 30 MG/ML IJ SOLN: 15.0000 mg | Freq: Once | INTRAMUSCULAR | Status: DC | PRN

## 2012-08-21 MED ORDER — GLYCOPYRROLATE 0.2 MG/ML IJ SOLN
INTRAMUSCULAR | Status: DC | PRN
  Administered 2012-08-21: 0.1 mg via INTRAVENOUS

## 2012-08-21 MED ORDER — BISACODYL 10 MG RE SUPP: 10.0000 mg | Freq: Every day | RECTAL | Status: DC | PRN

## 2012-08-21 MED ORDER — ACETAMINOPHEN 10 MG/ML IV SOLN
INTRAVENOUS | Status: AC
  Filled 2012-08-21: qty 100

## 2012-08-21 MED ORDER — HEMOSTATIC AGENTS (NO CHARGE) OPTIME
TOPICAL | Status: DC | PRN
  Administered 2012-08-21: 1

## 2012-08-21 MED ORDER — ESTRADIOL 0.1 MG/GM VA CREA
TOPICAL_CREAM | VAGINAL | Status: AC
  Filled 2012-08-21: qty 42.5

## 2012-08-21 MED ORDER — FENTANYL CITRATE 0.05 MG/ML IJ SOLN
INTRAMUSCULAR | Status: DC | PRN
  Administered 2012-08-21 (×3): 50 ug via INTRAVENOUS

## 2012-08-21 MED ORDER — FENTANYL CITRATE 0.05 MG/ML IJ SOLN: 25.0000 ug | INTRAMUSCULAR | Status: DC | PRN

## 2012-08-21 MED ORDER — NITROGLYCERIN 0.4 MG SL SUBL
0.4000 mg | SUBLINGUAL_TABLET | SUBLINGUAL | Status: DC | PRN
  Filled 2012-08-21: qty 25

## 2012-08-21 MED ORDER — METOPROLOL SUCCINATE ER 25 MG PO TB24
25.0000 mg | ORAL_TABLET | Freq: Every day | ORAL | Status: DC
  Administered 2012-08-22: 25 mg via ORAL
  Filled 2012-08-21 (×2): qty 1

## 2012-08-21 MED ORDER — ONDANSETRON HCL 4 MG/2ML IJ SOLN: 4.0000 mg | Freq: Four times a day (QID) | INTRAMUSCULAR | Status: DC | PRN

## 2012-08-21 MED ORDER — LACTATED RINGERS IV SOLN
INTRAVENOUS | Status: DC
  Administered 2012-08-21 (×2): via INTRAVENOUS

## 2012-08-21 MED ORDER — ACETAMINOPHEN 10 MG/ML IV SOLN
INTRAVENOUS | Status: DC | PRN
  Administered 2012-08-21: 1000 mg via INTRAVENOUS

## 2012-08-21 MED ORDER — MIDAZOLAM HCL 2 MG/2ML IJ SOLN
INTRAMUSCULAR | Status: AC
  Filled 2012-08-21: qty 2

## 2012-08-21 MED ORDER — ASPIRIN 81 MG PO TABS: 81.0000 mg | ORAL_TABLET | Freq: Every day | ORAL | Status: DC

## 2012-08-21 MED ORDER — MIDAZOLAM HCL 2 MG/2ML IJ SOLN: 0.5000 mg | Freq: Once | INTRAMUSCULAR | Status: AC | PRN

## 2012-08-21 MED ORDER — FENTANYL CITRATE 0.05 MG/ML IJ SOLN
INTRAMUSCULAR | Status: AC
  Filled 2012-08-21: qty 5

## 2012-08-21 MED ORDER — GLYCOPYRROLATE 0.2 MG/ML IJ SOLN
INTRAMUSCULAR | Status: AC
  Filled 2012-08-21: qty 1

## 2012-08-21 MED ORDER — FENTANYL CITRATE 0.05 MG/ML IJ SOLN
INTRAMUSCULAR | Status: DC | PRN
  Administered 2012-08-21: 25 ug via INTRAVENOUS
  Administered 2012-08-21 (×2): 50 ug via INTRAVENOUS

## 2012-08-21 MED ORDER — MIDAZOLAM HCL 2 MG/2ML IJ SOLN: 0.5000 mg | Freq: Once | INTRAMUSCULAR | Status: DC | PRN

## 2012-08-21 MED ORDER — SIMETHICONE 80 MG PO CHEW: 80.0000 mg | CHEWABLE_TABLET | Freq: Four times a day (QID) | ORAL | Status: DC | PRN

## 2012-08-21 MED ORDER — VASOPRESSIN 20 UNIT/ML IJ SOLN
INTRAMUSCULAR | Status: DC | PRN
  Administered 2012-08-21: 10 [IU]

## 2012-08-21 MED ORDER — LACTATED RINGERS IV SOLN: INTRAVENOUS | Status: DC | PRN

## 2012-08-21 MED ORDER — VASOPRESSIN 20 UNIT/ML IJ SOLN
INTRAMUSCULAR | Status: AC
  Filled 2012-08-21: qty 1

## 2012-08-21 MED ORDER — ATORVASTATIN CALCIUM 40 MG PO TABS
40.0000 mg | ORAL_TABLET | Freq: Every day | ORAL | Status: DC
  Filled 2012-08-21 (×2): qty 1

## 2012-08-21 MED ORDER — GLYCOPYRROLATE 0.2 MG/ML IJ SOLN
INTRAMUSCULAR | Status: DC | PRN
  Administered 2012-08-21: 0.2 mg via INTRAVENOUS

## 2012-08-21 MED ORDER — FENTANYL CITRATE 0.05 MG/ML IJ SOLN
INTRAMUSCULAR | Status: AC
  Filled 2012-08-21: qty 2

## 2012-08-21 MED ORDER — DEXAMETHASONE SODIUM PHOSPHATE 10 MG/ML IJ SOLN
INTRAMUSCULAR | Status: AC
  Filled 2012-08-21: qty 1

## 2012-08-21 MED ORDER — LACTATED RINGERS IV SOLN
INTRAVENOUS | Status: DC | PRN
  Administered 2012-08-21: 15:00:00 via INTRAVENOUS

## 2012-08-21 MED ORDER — MIDAZOLAM HCL 5 MG/5ML IJ SOLN
INTRAMUSCULAR | Status: DC | PRN
  Administered 2012-08-21: 2 mg via INTRAVENOUS

## 2012-08-21 MED ORDER — KETOROLAC TROMETHAMINE 30 MG/ML IJ SOLN
15.0000 mg | Freq: Once | INTRAMUSCULAR | Status: AC | PRN
  Administered 2012-08-21: 30 mg via INTRAVENOUS

## 2012-08-21 MED ORDER — LIDOCAINE HCL (CARDIAC) 20 MG/ML IV SOLN
INTRAVENOUS | Status: DC | PRN
  Administered 2012-08-21: 80 mg via INTRAVENOUS

## 2012-08-21 MED ORDER — PROPOFOL 10 MG/ML IV EMUL
INTRAVENOUS | Status: DC | PRN
  Administered 2012-08-21: 120 mg via INTRAVENOUS

## 2012-08-21 MED ORDER — KETOROLAC TROMETHAMINE 30 MG/ML IJ SOLN
INTRAMUSCULAR | Status: AC
  Administered 2012-08-21: 30 mg via INTRAVENOUS
  Filled 2012-08-21: qty 1

## 2012-08-21 MED ORDER — AMLODIPINE BESYLATE 2.5 MG PO TABS
2.5000 mg | ORAL_TABLET | Freq: Every day | ORAL | Status: DC
  Administered 2012-08-22: 5 mg via ORAL
  Filled 2012-08-21 (×2): qty 1

## 2012-08-21 MED ORDER — PNEUMOCOCCAL VAC POLYVALENT 25 MCG/0.5ML IJ INJ
0.5000 mL | INJECTION | INTRAMUSCULAR | Status: AC
  Administered 2012-08-22: 0.5 mL via INTRAMUSCULAR
  Filled 2012-08-21: qty 0.5

## 2012-08-21 MED ORDER — INDIGOTINDISULFONATE SODIUM 8 MG/ML IJ SOLN
INTRAMUSCULAR | Status: DC | PRN
  Administered 2012-08-21: 5 mL via INTRAVENOUS

## 2012-08-21 MED ORDER — ONDANSETRON HCL 4 MG/2ML IJ SOLN
INTRAMUSCULAR | Status: DC | PRN
  Administered 2012-08-21: 4 mg via INTRAVENOUS

## 2012-08-21 MED ORDER — ATROPINE SULFATE 0.4 MG/ML IJ SOLN
INTRAMUSCULAR | Status: AC
  Filled 2012-08-21: qty 1

## 2012-08-21 MED ORDER — MIDAZOLAM HCL 5 MG/5ML IJ SOLN
INTRAMUSCULAR | Status: DC | PRN
  Administered 2012-08-21: 1 mg via INTRAVENOUS

## 2012-08-21 MED ORDER — ROCURONIUM BROMIDE 50 MG/5ML IV SOLN
INTRAVENOUS | Status: AC
  Filled 2012-08-21: qty 1

## 2012-08-21 MED ORDER — EPHEDRINE SULFATE 50 MG/ML IJ SOLN
INTRAMUSCULAR | Status: DC | PRN
  Administered 2012-08-21: 5 mg via INTRAVENOUS

## 2012-08-21 MED ORDER — ONDANSETRON HCL 4 MG/2ML IJ SOLN
INTRAMUSCULAR | Status: AC
  Filled 2012-08-21: qty 2

## 2012-08-21 MED ORDER — PROPOFOL 10 MG/ML IV EMUL
INTRAVENOUS | Status: AC
  Filled 2012-08-21: qty 20

## 2012-08-21 MED ORDER — LISINOPRIL 20 MG PO TABS
20.0000 mg | ORAL_TABLET | Freq: Every day | ORAL | Status: DC
  Administered 2012-08-22: 20 mg via ORAL
  Filled 2012-08-21 (×2): qty 1

## 2012-08-21 MED ORDER — CLOPIDOGREL BISULFATE 75 MG PO TABS
75.0000 mg | ORAL_TABLET | Freq: Every day | ORAL | Status: DC
  Filled 2012-08-21 (×2): qty 1

## 2012-08-21 MED ORDER — KCL IN DEXTROSE-NACL 20-5-0.45 MEQ/L-%-% IV SOLN
INTRAVENOUS | Status: DC
  Administered 2012-08-22: 02:00:00 via INTRAVENOUS
  Filled 2012-08-21 (×6): qty 1000

## 2012-08-21 MED ORDER — MORPHINE SULFATE 4 MG/ML IJ SOLN: 1.0000 mg | INTRAMUSCULAR | Status: DC | PRN

## 2012-08-21 MED ORDER — ALBUTEROL SULFATE HFA 108 (90 BASE) MCG/ACT IN AERS
INHALATION_SPRAY | RESPIRATORY_TRACT | Status: AC
  Filled 2012-08-21: qty 6.7

## 2012-08-21 MED ORDER — PHENYLEPHRINE HCL 10 MG/ML IJ SOLN
INTRAMUSCULAR | Status: DC | PRN
  Administered 2012-08-21 (×6): 40 ug via INTRAVENOUS

## 2012-08-21 MED ORDER — LIDOCAINE HCL (CARDIAC) 20 MG/ML IV SOLN
INTRAVENOUS | Status: AC
  Filled 2012-08-21: qty 5

## 2012-08-21 SURGICAL SUPPLY — 27 items
CANISTER SUCTION 2500CC (MISCELLANEOUS) ×2 IMPLANT
CLOTH BEACON ORANGE TIMEOUT ST (SAFETY) ×2 IMPLANT
CONT PATH 16OZ SNAP LID 3702 (MISCELLANEOUS) IMPLANT
DECANTER SPIKE VIAL GLASS SM (MISCELLANEOUS) IMPLANT
DRESSING TELFA 8X3 (GAUZE/BANDAGES/DRESSINGS) ×2 IMPLANT
GAUZE PACKING 2X5 YD STERILE (GAUZE/BANDAGES/DRESSINGS) IMPLANT
GLOVE BIO SURGEON STRL SZ7 (GLOVE) ×4 IMPLANT
GLOVE BIOGEL PI IND STRL 6.5 (GLOVE) ×1 IMPLANT
GLOVE BIOGEL PI IND STRL 7.0 (GLOVE) ×1 IMPLANT
GLOVE BIOGEL PI INDICATOR 6.5 (GLOVE) ×1
GLOVE BIOGEL PI INDICATOR 7.0 (GLOVE) ×1
GOWN PREVENTION PLUS XLARGE (GOWN DISPOSABLE) ×2 IMPLANT
GOWN STRL REIN XL XLG (GOWN DISPOSABLE) ×8 IMPLANT
NDL SPNL 22GX3.5 QUINCKE BK (NEEDLE) IMPLANT
NEEDLE SPNL 22GX3.5 QUINCKE BK (NEEDLE) IMPLANT
NS IRRIG 1000ML POUR BTL (IV SOLUTION) ×2 IMPLANT
PACK VAGINAL WOMENS (CUSTOM PROCEDURE TRAY) ×2 IMPLANT
PAD OB MATERNITY 4.3X12.25 (PERSONAL CARE ITEMS) ×2 IMPLANT
SUT VIC AB 0 CT1 18XCR BRD8 (SUTURE) ×3 IMPLANT
SUT VIC AB 0 CT1 27 (SUTURE) ×4
SUT VIC AB 0 CT1 27XBRD ANBCTR (SUTURE) ×2 IMPLANT
SUT VIC AB 0 CT1 8-18 (SUTURE) ×6
SUT VICRYL 0 TIES 12 18 (SUTURE) ×2 IMPLANT
SYR 30ML LL (SYRINGE) IMPLANT
TOWEL OR 17X24 6PK STRL BLUE (TOWEL DISPOSABLE) ×4 IMPLANT
TRAY FOLEY CATH 14FR (SET/KITS/TRAYS/PACK) ×2 IMPLANT
WATER STERILE IRR 1000ML POUR (IV SOLUTION) ×2 IMPLANT

## 2012-08-21 SURGICAL SUPPLY — 20 items
BANDAGE GAUZE ELAST BULKY 4 IN (GAUZE/BANDAGES/DRESSINGS) ×1 IMPLANT
DISSECTOR SPONGE CHERRY (GAUZE/BANDAGES/DRESSINGS) ×1 IMPLANT
GLOVE BIO SURGEON STRL SZ7 (GLOVE) ×3 IMPLANT
GLOVE BIOGEL PI IND STRL 6.5 (GLOVE) IMPLANT
GLOVE BIOGEL PI IND STRL 7.0 (GLOVE) IMPLANT
GLOVE BIOGEL PI INDICATOR 6.5 (GLOVE) ×1
GLOVE BIOGEL PI INDICATOR 7.0 (GLOVE) ×2
GOWN SRG XL XLNG 56XLVL 4 (GOWN DISPOSABLE) IMPLANT
GOWN STRL NON-REIN XL XLG LVL4 (GOWN DISPOSABLE) ×4
HEMOSTAT SURGICEL 4X8 (HEMOSTASIS) ×1 IMPLANT
NDL SPNL 22GX3.5 QUINCKE BK (NEEDLE) IMPLANT
NEEDLE SPNL 22GX3.5 QUINCKE BK (NEEDLE) ×2 IMPLANT
PACK VAGINAL WOMENS (CUSTOM PROCEDURE TRAY) ×1 IMPLANT
PAD OB MATERNITY 4.3X12.25 (PERSONAL CARE ITEMS) ×1 IMPLANT
SURGIFLO W/THROMBIN 8M KIT (HEMOSTASIS) ×1 IMPLANT
SUT VIC AB 0 CT1 27 (SUTURE) ×4
SUT VIC AB 0 CT1 27XBRD ANBCTR (SUTURE) IMPLANT
SUT VIC AB 3-0 SH 27 (SUTURE) ×2
SUT VIC AB 3-0 SH 27XBRD (SUTURE) IMPLANT
SYR 30ML LL (SYRINGE) ×1 IMPLANT

## 2012-08-21 NOTE — Anesthesia Postprocedure Evaluation (Signed)
Anesthesia Post Note  Patient: Melanie Mccarty  Procedure(s) Performed: Procedure(s) (LRB): REPAIR VAGINAL CUFF (N/A)  Anesthesia type: GA  Patient location: PACU  Post pain: Pain level controlled  Post assessment: Post-op Vital signs reviewed  Last Vitals:  Filed Vitals:   08/21/12 1730  BP: 180/61  Pulse: 61  Temp:   Resp: 15    Post vital signs: Reviewed  Level of consciousness: sedated  Complications: No apparent anesthesia complications

## 2012-08-21 NOTE — Anesthesia Postprocedure Evaluation (Signed)
Anesthesia Post Note  Patient: Melanie Mccarty  Procedure(s) Performed: Procedure(s) (LRB): HYSTERECTOMY VAGINAL (N/A)  Anesthesia type: General  Patient location: PACU  Post pain: Pain level controlled  Post assessment: Post-op Vital signs reviewed  Last Vitals:  Filed Vitals:   08/21/12 1130  BP: 172/71  Pulse: 60  Temp: 36.6 C  Resp: 15    Post vital signs: Reviewed  Level of consciousness: sedated  Complications: No apparent anesthesia complications

## 2012-08-21 NOTE — Anesthesia Preprocedure Evaluation (Signed)
Anesthesia Evaluation  Patient identified by MRN, date of birth, ID band Patient awake    Reviewed: Allergy & Precautions, H&P , Patient's Chart, lab work & pertinent test results, reviewed documented beta blocker date and time   History of Anesthesia Complications Negative for: history of anesthetic complications  Airway Mallampati: II TM Distance: >3 FB Neck ROM: full    Dental No notable dental hx.    Pulmonary neg pulmonary ROS, COPDCurrent Smoker,  breath sounds clear to auscultation  Pulmonary exam normal       Cardiovascular Exercise Tolerance: Good hypertension, + CAD negative cardio ROS  Rhythm:regular Rate:Normal     Neuro/Psych negative neurological ROS  negative psych ROS   GI/Hepatic negative GI ROS, Neg liver ROS, (+) Hepatitis -, B  Endo/Other  negative endocrine ROS  Renal/GU negative Renal ROS     Musculoskeletal   Abdominal   Peds  Hematology negative hematology ROS (+)   Anesthesia Other Findings Hypertension     Chest pain    consistent with unstable angina    Tobacco abuse   with possible chronic obstructive pulmonary   disease.  Instructed to follow up with primary care for evaluation   of lung function.  Coronary artery disease 07/2010 STATUS POST PTCA AND STENTING OF HER RIGHT CORONARY ARTERY    Hyperlipidemia     Hepatitis   Hep B at age 62 yr old    Reproductive/Obstetrics negative OB ROS                           Anesthesia Physical  Anesthesia Plan  ASA: III and emergent  Anesthesia Plan: General ETT   Post-op Pain Management:    Induction:   Airway Management Planned:   Additional Equipment:   Intra-op Plan:   Post-operative Plan:   Informed Consent: I have reviewed the patients History and Physical, chart, labs and discussed the procedure including the risks, benefits and alternatives for the proposed anesthesia with the patient or authorized  representative who has indicated his/her understanding and acceptance.   Dental Advisory Given  Plan Discussed with: CRNA and Surgeon  Anesthesia Plan Comments:        for emergent cuff repair... Still NPO from this morning Anesthesia Quick Evaluation

## 2012-08-21 NOTE — Brief Op Note (Signed)
08/21/2012  10:51 AM  PATIENT:  Melanie Mccarty  62 y.o. female  PRE-OPERATIVE DIAGNOSIS:  Complete uterine prolapse  POST-OPERATIVE DIAGNOSIS:  Complete uterine prolapse  PROCEDURE:  Procedure(s) (LRB) with comments: HYSTERECTOMY VAGINAL (N/A)  SURGEON:  Surgeon(s) and Role:    * Willodean Rosenthal, MD - Primary    * Tereso Newcomer, MD - Assisting  ANESTHESIA:   general  EBL:  Total I/O In: 1200 [I.V.:1200] Out: - <50cc  BLOOD ADMINISTERED:none  DRAINS: none   LOCAL MEDICATIONS USED:  OTHER dilute vasopressin  SPECIMEN:  Source of Specimen:  uterus  DISPOSITION OF SPECIMEN:  PATHOLOGY  COUNTS:  YES  TOURNIQUET:  * No tourniquets in log *  DICTATION: .Note written in EPIC  PLAN OF CARE: Admit for overnight observation  PATIENT DISPOSITION:  PACU - hemodynamically stable.   Delay start of Pharmacological VTE agent (>24hrs) due to surgical blood loss or risk of bleeding: not applicable

## 2012-08-21 NOTE — Anesthesia Procedure Notes (Signed)
Procedure Name: LMA Insertion Date/Time: 08/21/2012 9:25 AM Performed by: Maris Berger T Pre-anesthesia Checklist: Patient identified, Emergency Drugs available, Suction available and Patient being monitored Patient Re-evaluated:Patient Re-evaluated prior to inductionOxygen Delivery Method: Circle System Utilized Preoxygenation: Pre-oxygenation with 100% oxygen Intubation Type: IV induction Ventilation: Mask ventilation without difficulty LMA: LMA inserted LMA Size: 4.0 Number of attempts: 1 Placement Confirmation: positive ETCO2 Tube secured with: Tape Dental Injury: Teeth and Oropharynx as per pre-operative assessment

## 2012-08-21 NOTE — H&P (Signed)
Melanie Mccarty is an 62 y.o. female. Patient for total vaginal hysterectomy with BSO.  No new complaints.  Pertinent Gynecological History: Menses: post-menopausal Bleeding: n/a Contraception: none DES exposure: unknown Blood transfusions: none Sexually transmitted diseases: no past history Previous GYN Procedures: BTL  Last mammogram: normal Date: 2013 Last pap: normal Date: 2013 OB History: G3, P2   Menstrual History: Menarche age: 62y/o No LMP recorded. Patient is postmenopausal.    Past Medical History  Diagnosis Date  . Hypertension   . Chest pain      consistent with unstable angina  . Tobacco abuse     with possible chronic obstructive pulmonary   disease.  Instructed to follow up with primary care for evaluation   of lung function.   . Coronary artery disease 07/2010    STATUS POST PTCA AND STENTING OF HER RIGHT CORONARY ARTERY  . Hyperlipidemia   . Hepatitis     Hep B at age 17 yr old    Past Surgical History  Procedure Date  . Tubal ligation   . Cardiac catheterization 07/2010    ejection fraction of 60%    Family History  Problem Relation Age of Onset  . Heart failure Mother 87  . Hypertension Mother   . Heart failure Father 61  . Diabetes type II Father   . Hypertension Sister   . Heart failure Brother 59    Social History:  reports that she has been smoking Cigarettes.  She has a 82 pack-year smoking history. She has never used smokeless tobacco. She reports that she does not drink alcohol or use illicit drugs. current 1/2 PPD. Began at age 42years.  Has cut down.    Allergies:  Allergies  Allergen Reactions  . Penicillins Hives    Prescriptions prior to admission  Medication Sig Dispense Refill  . amLODipine (NORVASC) 2.5 MG tablet Take 2.5 mg by mouth daily.      Marland Kitchen aspirin 81 MG tablet Take 81 mg by mouth daily.        Marland Kitchen atorvastatin (LIPITOR) 40 MG tablet Take 1 tablet (40 mg total) by mouth daily.  90 tablet  1  . Calcium  Carbonate-Vitamin D (CALCIUM + D PO) Take 600 mg by mouth daily.        . clopidogrel (PLAVIX) 75 MG tablet Take 1 tablet (75 mg total) by mouth daily.  30 tablet  11  . conjugated estrogens (PREMARIN) vaginal cream 1 gram per vagina 2 nights per week  42.5 g  12  . fish oil-omega-3 fatty acids 1000 MG capsule Take 2 g by mouth daily.      Marland Kitchen lisinopril (PRINIVIL,ZESTRIL) 20 MG tablet Take 20 mg by mouth daily.        . metoprolol succinate (TOPROL-XL) 25 MG 24 hr tablet Take 1 tablet (25 mg total) by mouth daily.  30 tablet  5  . Multiple Vitamin (MULTIVITAMIN WITH MINERALS) TABS Take 1 tablet by mouth daily.      . nitroGLYCERIN (NITROSTAT) 0.4 MG SL tablet Place 0.4 mg under the tongue every 5 (five) minutes as needed.          ROS  Pulse 75, temperature 98.1 F (36.7 C), temperature source Oral, resp. rate 20, SpO2 98.00%. Physical Exam Lungs: CTA CV: RRR Abd: soft, NT/ND GYN: not repeated today.  Complete procedentia  No results found for this or any previous visit (from the past 24 hour(s)).  No results found.  Assessment/Plan: TVH/BSO All question  answered Informed consent obtained  HARRAWAY-SMITH, Cashmere Dingley 08/21/2012, 9:04 AM

## 2012-08-21 NOTE — Progress Notes (Signed)
Day of Surgery Procedure(s) (LRB): HYSTERECTOMY VAGINAL (N/A)  Subjective: Patient reports vaginal bleeding   Objective: I have reviewed patient's vital signs and medications.  General: alert and cooperative Vaginal Bleeding: moderate with clots, estimate 100 ml total. Cuff appears intact and vaginal pack placed  Assessment: s/p Procedure(s) (LRB) with comments: HYSTERECTOMY VAGINAL (N/A): bleeding at cuff  Plan: Observe for recurrent bleeding  LOS: 0 days    Melanie Mccarty 08/21/2012, 1:52 PM

## 2012-08-21 NOTE — Anesthesia Procedure Notes (Signed)
Procedure Name: LMA Insertion Date/Time: 08/21/2012 3:32 PM Performed by: Graciela Husbands Pre-anesthesia Checklist: Suction available, Timeout performed, Emergency Drugs available, Patient identified and Patient being monitored Patient Re-evaluated:Patient Re-evaluated prior to inductionOxygen Delivery Method: Circle system utilized Preoxygenation: Pre-oxygenation with 100% oxygen Intubation Type: IV induction Ventilation: Mask ventilation without difficulty LMA: LMA inserted LMA Size: 4.0 Number of attempts: 1 Placement Confirmation: positive ETCO2 and breath sounds checked- equal and bilateral Tube secured with: Tape Dental Injury: Teeth and Oropharynx as per pre-operative assessment

## 2012-08-21 NOTE — Transfer of Care (Signed)
Immediate Anesthesia Transfer of Care Note  Patient: Melanie Mccarty  Procedure(s) Performed: Procedure(s) (LRB) with comments: HYSTERECTOMY VAGINAL (N/A)  Patient Location: PACU  Anesthesia Type:General  Level of Consciousness: awake and oriented  Airway & Oxygen Therapy: Patient Spontanous Breathing and Patient connected to nasal cannula oxygen  Post-op Assessment: Report given to PACU RN  Post vital signs: Reviewed and stable  Complications: No apparent anesthesia complications

## 2012-08-21 NOTE — Anesthesia Preprocedure Evaluation (Signed)
Anesthesia Evaluation  Patient identified by MRN, date of birth, ID band Patient awake    Reviewed: Allergy & Precautions, H&P , Patient's Chart, lab work & pertinent test results, reviewed documented beta blocker date and time   History of Anesthesia Complications Negative for: history of anesthetic complications  Airway Mallampati: II TM Distance: >3 FB Neck ROM: full    Dental No notable dental hx.    Pulmonary neg pulmonary ROS, COPDCurrent Smoker,  breath sounds clear to auscultation  Pulmonary exam normal       Cardiovascular Exercise Tolerance: Good hypertension, + CAD negative cardio ROS  Rhythm:regular Rate:Normal     Neuro/Psych negative neurological ROS  negative psych ROS   GI/Hepatic negative GI ROS, Neg liver ROS, (+) Hepatitis -, B  Endo/Other  negative endocrine ROS  Renal/GU negative Renal ROS     Musculoskeletal   Abdominal   Peds  Hematology negative hematology ROS (+)   Anesthesia Other Findings Hypertension     Chest pain    consistent with unstable angina    Tobacco abuse   with possible chronic obstructive pulmonary   disease.  Instructed to follow up with primary care for evaluation   of lung function.  Coronary artery disease 07/2010 STATUS POST PTCA AND STENTING OF HER RIGHT CORONARY ARTERY    Hyperlipidemia     Hepatitis   Hep B at age 62 yr old    Reproductive/Obstetrics negative OB ROS                           Anesthesia Physical Anesthesia Plan  ASA: III  Anesthesia Plan: General ETT   Post-op Pain Management:    Induction:   Airway Management Planned:   Additional Equipment:   Intra-op Plan:   Post-operative Plan:   Informed Consent: I have reviewed the patients History and Physical, chart, labs and discussed the procedure including the risks, benefits and alternatives for the proposed anesthesia with the patient or authorized representative who  has indicated his/her understanding and acceptance.   Dental Advisory Given  Plan Discussed with: CRNA and Surgeon  Anesthesia Plan Comments:         Anesthesia Quick Evaluation

## 2012-08-21 NOTE — Transfer of Care (Signed)
Immediate Anesthesia Transfer of Care Note  Patient: Melanie Mccarty  Procedure(s) Performed: Procedure(s) (LRB) with comments: REPAIR VAGINAL CUFF (N/A) - Exploration and Revision of Vaginal Cuff  Patient Location: PACU  Anesthesia Type:General  Level of Consciousness: awake, alert  and oriented  Airway & Oxygen Therapy: Patient Spontanous Breathing and Patient connected to nasal cannula oxygen  Post-op Assessment: Report given to PACU RN and Post -op Vital signs reviewed and stable  Post vital signs: Reviewed and stable  Complications: No apparent anesthesia complications

## 2012-08-22 ENCOUNTER — Encounter (HOSPITAL_COMMUNITY): Payer: Self-pay | Admitting: Obstetrics & Gynecology

## 2012-08-22 LAB — COMPREHENSIVE METABOLIC PANEL
Albumin: 3 g/dL — ABNORMAL LOW (ref 3.5–5.2)
Alkaline Phosphatase: 52 U/L (ref 39–117)
BUN: 10 mg/dL (ref 6–23)
Creatinine, Ser: 0.51 mg/dL (ref 0.50–1.10)
Potassium: 3.9 mEq/L (ref 3.5–5.1)
Total Protein: 6.2 g/dL (ref 6.0–8.3)

## 2012-08-22 LAB — CBC
HCT: 35.5 % — ABNORMAL LOW (ref 36.0–46.0)
MCHC: 32.4 g/dL (ref 30.0–36.0)
RDW: 12.9 % (ref 11.5–15.5)

## 2012-08-22 MED ORDER — CIPROFLOXACIN HCL 500 MG PO TABS: 500.0000 mg | ORAL_TABLET | Freq: Two times a day (BID) | ORAL | Status: DC

## 2012-08-22 MED ORDER — CLOPIDOGREL BISULFATE 75 MG PO TABS
75.0000 mg | ORAL_TABLET | Freq: Every day | ORAL | Status: DC
  Administered 2012-08-22: 75 mg via ORAL
  Filled 2012-08-22 (×2): qty 1

## 2012-08-22 MED ORDER — TRAMADOL HCL 50 MG PO TABS: 50.0000 mg | ORAL_TABLET | Freq: Four times a day (QID) | ORAL | Status: DC | PRN

## 2012-08-22 MED ORDER — METRONIDAZOLE 500 MG PO TABS: 500.0000 mg | ORAL_TABLET | Freq: Two times a day (BID) | ORAL | Status: AC

## 2012-08-22 MED ORDER — ASPIRIN 81 MG PO CHEW
81.0000 mg | CHEWABLE_TABLET | Freq: Every day | ORAL | Status: DC
Start: 2012-08-22 — End: 2012-08-22
  Administered 2012-08-22: 81 mg via ORAL
  Filled 2012-08-22 (×2): qty 1

## 2012-08-22 NOTE — Anesthesia Postprocedure Evaluation (Signed)
  Anesthesia Post-op Note  Patient: Melanie Mccarty  Procedure(s) Performed: Procedure(s) (LRB) with comments: HYSTERECTOMY VAGINAL (N/A)  Patient Location: [facility]  Anesthesia Type:General  Level of Consciousness: awake  Airway and Oxygen Therapy: Patient Spontanous Breathing  Post-op Pain: none  Post-op Assessment: Post-op Vital signs reviewed  Post-op Vital Signs: Reviewed and stable  Complications: No apparent anesthesia complications

## 2012-08-22 NOTE — Procedures (Signed)
INDICATIONS: The patient is a [id number] with history of complete uterine prolapse. The patient made a decision to undergo definite surgical treatment. On the preoperative visit, the risks, benefits, indications, and alternatives of the procedure were reviewed with the patient.  On the day of surgery, the risks of surgery were again discussed with the patient including but not limited to: bleeding which may require transfusion or reoperation; infection which may require antibiotics; injury to bowel, bladder, ureters or other surrounding organs; need for additional procedures; thromboembolic phenomenon, incisional problems and other postoperative/anesthesia complications. Written informed consent was obtained.    OPERATIVE FINDINGS: A 6 week size uterus completely prolapsed.  Streak ovaries that could not be located intraoperatively on either side.  SPECIMENS:  Uterus and cervix sent to pathology COMPLICATIONS:  None immediate.  DESCRIPTION OF PROCEDURE:  The patient received intravenous antibiotics and had sequential compression devices applied to her lower extremities while in the preoperative area.  She was then taken to the operating room where general anesthesia was administered and was found to be adequate.  She was placed in the dorsal lithotomy position, and was prepped and draped in a sterile manner.  Her bladder was drained with a red rubber catheter. After an adequate timeout was performed, attention was turned to her pelvis.  A weighted speculum was then placed in the vagina, and the anterior and posterior lips of the cervix were grasped bilaterally with tenaculums.  The cervix was then injected circumferentially with a dilute vasopressin solution to maintain hemostasis.  The cervix was then circumferentially incised, and the bladder was dissected off the pubocervical fascia anteriorly without complication.  The posterior cul de sac was entered sharply and a suture was placed.  A Haney retractor  was placed in the posterior vagina.  The Heaney clamp was then used to clamp the uterosacral ligaments on either side.  They were then cut and sutured ligated with 0 Vicryl, and the ligated uterosacral ligaments were transfixed to the posterior lateral vaginal epithelium to further support the vagina and provide hemostasis.The anterior cul-de-sac was then entered sharply without difficulty and a retractor was placed. Of note, all sutures used in this case were 0 Vicryl unless otherwise noted.   The cardinal ligaments were then clamped, cut and ligated. The uterine vessels and broad ligaments were then serially clamped with the Heaney clamps, cut, and suture ligated on both sides.  Excellent hemostasis was noted at this point.  The uterus was then delivered via the posterior cul-de-sac, and the cornua were clamped with the Heaney clamps, transected, and the uterus was delivered and sent to pathology. These pedicles were then suture ligated to ensure hemostasis.  After completion of the hysterectomy, all pedicles from the uterosacral ligament to the cornua were examined hemostasis was confirmed.  An exploration of the pelvis revealed only streak ovaries that were high in the abdominal cavity. The vaginal cuff was then closed with a in a running locked fashion with care given to incorporate the uterosacral pedicles bilaterally.  Interrupted figure of eight sutures was used to reapproximate the vaginal cuff. All instruments were then removed from the pelvis.  The patient tolerated the procedure well.  All instruments, needles, and sponge counts were correct x 2. The patient was taken to the recovery room in stable condition.

## 2012-08-22 NOTE — Addendum Note (Signed)
Addendum  created 08/22/12 0803 by Suella Grove, CRNA   Modules edited:Notes Section

## 2012-08-22 NOTE — Discharge Summary (Signed)
Physician Discharge Summary  Patient ID: Melanie Mccarty MRN: [id number] DOB/AGE: [birth year] 62 y.o.  Admit date: 08/21/2012 Discharge date: 08/22/2012  Admission Diagnoses: Uterine prolapse   Discharge Diagnoses:  Principal Problem:  *Uterine prolapse   Discharged Condition: good  Hospital Course: Pt admitted for [facility].  After surgery patient was noted to have bleeding from the vaginal cuff.  She was taken back to surgery to over sew the cuff and place flow seal.  Overnight patients had no complaints and required no pain meds post operatively.  She denies nausea or vomiting and feels hungry.  She reports + flatus this am.  She also denies fever or chills.  She has voided without difficulty and had no further vaginal bleeding overnight.  Consults: None  Significant Diagnostic Studies: labs: CBC  Treatments: surgery: TVH and back to the OR for vaginal bleeding and control of bleeding from the vaginal cuff.  Discharge Exam: Blood pressure 138/63, pulse 88, temperature 98.2 F (36.8 C), temperature source Oral, resp. rate 18, height 5\' 2"  (1.575 m), weight 153 lb (69.4 kg), SpO2 93.00%. General appearance: alert and no distress Resp: clear to auscultation bilaterally Cardio: regular rate and rhythm, S1, S2 normal, no murmur, click, rub or gallop GI: soft, non-tender; bowel sounds normal; no masses,  no organomegaly GU: vaginal packing removed and found to have only minimal blood. Disposition: 06-Home-Health Care Svc  Discharge Orders    Future Appointments: Provider: Department: Dept Phone: Center:   09/03/2012 3:45 PM Willodean Rosenthal, MD [facility] [phone] [facility]   09/24/2012 8:15 AM [facility] [place]) [phone] [facility]     Future Orders Please Complete By Expires   Diet general      Increase activity slowly      Lifting restrictions      Comments:   No lifting above 10#.  DEFINITELY NO LIFTING OF CHILDREN!   Sexual Activity Restrictions      Comments:   Nothing in vagina for a full 6 weeks   Call MD for:  temperature >100.4      Call MD for:  persistant nausea and vomiting      Call MD for:  severe uncontrolled pain      Call MD for:      Scheduling Instructions:   Chest pain or ANY cardiac symptoms.  Go to the ER immediatley!       Medication List     As of 08/22/2012 10:20 AM    STOP taking these medications         conjugated estrogens vaginal cream   Commonly known as: PREMARIN      TAKE these medications         aspirin 81 MG tablet   Take 81 mg by mouth daily.      atorvastatin 40 MG tablet   Commonly known as: LIPITOR   Take 1 tablet (40 mg total) by mouth daily.      CALCIUM + D PO   Take 600 mg by mouth daily.      ciprofloxacin 500 MG tablet   Commonly known as: CIPRO   Take 1 tablet (500 mg total) by mouth 2 (two) times daily.      clopidogrel 75 MG tablet   Commonly known as: PLAVIX   Take 1 tablet (75 mg total) by mouth daily.      fish oil-omega-3 fatty acids 1000 MG capsule   Take 2 g by mouth daily.  lisinopril 20 MG tablet   Commonly known as: PRINIVIL,ZESTRIL   Take 20 mg by mouth daily.      metoprolol succinate 25 MG 24 hr tablet   Commonly known as: TOPROL-XL   Take 1 tablet (25 mg total) by mouth daily.      metroNIDAZOLE 500 MG tablet   Commonly known as: FLAGYL   Take 1 tablet (500 mg total) by mouth 2 (two) times daily.      multivitamin with minerals Tabs   Take 1 tablet by mouth daily.      nitroGLYCERIN 0.4 MG SL tablet   Commonly known as: NITROSTAT   Place 0.4 mg under the tongue every 5 (five) minutes as needed.      NORVASC 2.5 MG tablet   Generic drug: amLODipine   Take 2.5 mg by mouth daily.      traMADol 50 MG tablet   Commonly known as: ULTRAM   Take 1 tablet (50 mg total) by mouth every 6 (six) hours as needed for pain.           Follow-up Information    Follow up with [facility], Sherron Mummert, MD. In 2  weeks.   Contact information:   [address] [place] [address] [phone]          Signed: Willodean Rosenthal 08/22/2012, 10:20 AM

## 2012-08-22 NOTE — Progress Notes (Signed)
UR completed 

## 2012-08-22 NOTE — Procedures (Signed)
I was called to see patient post op due to increased vaginal bleeding.  Despite vaginal packing patient had persistent bleeding per vagina of ~100cc of blood.  After informed consent was obtained the patient was taken to the operating room where general anesthesia was performed with LMA.  The patient was prepped and draped in the usual sterile fashion and placed in the dorsal lithotomy position.  A weighted speculum was placed in the patients vagina and there was noted to be oozing from the vaginal cuff.  (the patient has been on Plavix and ASA) There was no active bleeding but there was blood oozing from beyond the cuff so the cuff was opened and the pedicles were explored.  There was no ACTIVE bleeding from the pedicles but, they too were oozing slightly.  The cuff was reefed with another 0 vicryl around the entire cuff in a running locked fashion.  Flow seal was then injected into the cavity and the cuff was reapproximated with 2 figure-of-8 sutures of 0 vicryl.  Excellent hemostasis was noted. A cling vaginal pack was placed.  Sponge, lap and needle count were correct x2.  Patient was awakened and taken to recovery room in stable condition.

## 2012-09-03 ENCOUNTER — Ambulatory Visit (INDEPENDENT_AMBULATORY_CARE_PROVIDER_SITE_OTHER): Payer: Self-pay | Admitting: Obstetrics & Gynecology

## 2012-09-03 ENCOUNTER — Encounter: Payer: Self-pay | Admitting: Obstetrics & Gynecology

## 2012-09-03 VITALS — BP 135/71 | HR 63 | Temp 97.2°F | Ht 62.0 in | Wt 146.8 lb

## 2012-09-03 DIAGNOSIS — N812 Incomplete uterovaginal prolapse: Secondary | ICD-10-CM

## 2012-09-03 NOTE — Progress Notes (Signed)
Subjective:     Patient ID: Melanie Mccarty, female   DOB: 06-30-1951, 62 y.o.   MRN: [id number]  HPI Pt s/p [facility] for complete procedentia.  She has no complaints.  She reports that she never took any pain meds.  She is voiding without difficulty and denies leakage of urine.  Review of Systems     Objective:   Physical ExamBP 135/71  Pulse 63  Temp 97.2 F (36.2 C) (Oral)  Ht 5\' 2"  (1.575 m)  Wt 146 lb 12.8 oz (66.588 kg)  BMI 26.85 kg/m2 Abd: soft, NT, ND  surg path 08/21/12 Uterus and cervix - ENDOMETRIUM: BENIGN WEAKLY PROLIFERATIVE ENDOMETRIUM, NO ATYPIA, HYPERPLASIA OR MALIGNANCY. - CERVIX: BENIGN SQUAMOUS MUCOSA AND ENDOCERVICAL MUCOSA WITH ASSOCIATED HEMORRHAGE AND REACTIVE/REGENERATIVE CHANGES, NO EVIDENCE OF DYSPLASIA OR MALIGNANCY.      Assessment:     2 week post op check- no problems    Plan:     F/u 4 weeks  Gradually increase activities

## 2012-09-03 NOTE — Patient Instructions (Signed)
Secondhand Smoke Secondhand smoke is the smoke exhaled by smokersand the smoke given off by a burning cigarette, cigar, or pipe. When a cigarette is smoked, about half of the smoke is inhaled and exhaled by the smoker, and the other half floats around in the air. Exposure to secondhand smoke is also called involuntary smoking or passive smoking. People can be exposed to secondhand smoke in:   Homes.  Cars.  Workplaces.  Public places (bars, restaurants, other recreation sites). Exposure to secondhand smoke is hazardous.It contains more than 250 harmful chemicals, including at least 60 that can cause cancer. These chemicals include:  Arsenic, a heavy metal toxin.  Benzene, a chemical found in gasoline.  Beryllium, a toxic metal.  Cadmium, a metal used in batteries.  Chromium, a metallic element.  Ethylene oxide, a chemical used to sterilize medical devices.  Nickel, a metallic element.  Polonium 210, a chemical element that gives off radiation.  Vinyl chloride, a toxic substance used in the Building control surveyor. Nonsmoking spouses and family members of smokers have higher rates of cancer, heart disease, and serious respiratory illnesses than those not exposed to secondhand smoke.  Nicotine, a nicotine by-product called cotinine, carbon monoxide, and other evidence of secondhand smoke exposure have been found in the body fluids of nonsmokers exposed to secondhand smoke.  Living with a smoker may increase a nonsmoker's chances of developing lung cancer by 20 to 30 percent.  Secondhand smoke may increase the risk of breast cancer, nasal sinus cavity cancer, cervical cancer, bladder cancer, and nose and throat (nasopharyngeal)cancer in adults.  Secondhand smoke may increase the risk of heart disease by 25 to 30 percent. Children are especially at risk from secondhand smoke exposure. Children of smokers have higher rates  of:  Pneumonia.  Asthma.  Smoking.  Bronchitis.  Colds.  Chronic cough.  Ear infections.  Tonsilitis.  School absences. Research suggests that exposure to secondhand smoke may cause leukemia, lymphoma, and brain tumors in children. Babies are three times more likely to die from sudden infant death syndrome (SIDS) if their mothers smoked during and after pregnancy. There is no safe level of exposure to secondhand smoke. Studies have shown that even low levels of exposure can be harmful. The only way to fully protect nonsmokers from secondhand smoke exposure is to completely eliminate smoking in indoor spaces. The best thing you can do for your own health and for your children's health is to stop smoking. You should stop as soon as possible. This is not easy, and you may fail several times at quitting before you get free of this addiction. Nicotine replacement therapy ( such as patches, gum, or lozenges) can help. These therapies can help you deal with the physical symptoms of withdrawal. Attending quit-smoking support groups can help you deal with the emotional issues of quitting smoking.  Even if you are not ready to quit right now, there are some simple changes you can make to reduce the effect of your smoking on your family:  Do not smoke in your home. Smoke away from your home in an open area, preferably outside.  Ask others to not smoke in your home.  Do not smoke while holding a child or when children are near.  Do not smoke in your car.  Avoid restaurants, day care centers, and other places that allow smoking. Document Released: 08/23/2004 Document Revised: 10/08/2011 Document Reviewed: 04/27/2009 [facility] Patient Information 2013 [place], [place]. Prolapse  Prolapse means the falling down, bulging, dropping, or drooping  of a body part. Organs that commonly prolapse include the rectum, small intestine, bladder, urethra, vagina (birth canal), uterus (womb), and cervix.  Prolapse occurs when the ligaments and muscle tissue around the rectum, bladder, and uterus are damaged or weakened.  CAUSES  This happens especially with:  Childbirth. Some women feel pelvic pressure or have trouble holding their urine right after childbirth, because of stretching and tearing of pelvic tissues. This generally gets better with time and the feeling usually goes away, but it may return with aging.  Chronic heavy lifting.  Aging.  Menopause, with loss of estrogen production weakening the pelvic ligaments and muscles.  Past pelvic surgery.  Obesity.  Chronic constipation.  Chronic cough. Prolapse may affect a single organ, or several organs may prolapse at the same time. The front wall of the vagina holds up the bladder. The back wall holds up part of the lower intestine, or rectum. The uterus fills a spot in the middle. All these organs can be involved when the ligaments and muscles around the vagina relax too much. This often gets worse when women stop producing estrogen (menopause). SYMPTOMS  Uncontrolled loss of urine (incontinence) with cough, sneeze, straining, and exercise.  More force may be required to have a bowel movement, due to trapping of the stool.  When part of an organ bulges through the opening of the vagina, there is sometimes a feeling of heaviness or pressure. It may feel as though something is falling out. This sensation increases with coughing or bearing down.  If the organs protrude through the opening of the vagina and rub against the clothing, there may be soreness, ulcers, infection, pain, and bleeding.  Lower back pain.  Pushing in the upper or lower part of the vagina, to pass urine or have a bowel movement.  Problems having sexual intercourse.  Being unable to insert a tampon or applicator. DIAGNOSIS  Usually, a physical exam is all that is needed to identify the problem. During the examination, you may be asked to cough and strain  while lying down, sitting up, and standing up. Your caregiver will determine if more testing is required, such as bladder function tests. Some diagnoses are:  Cystocele: Bulging and falling of the bladder into the top of the vagina.  Rectocele: Part of the rectum bulging into the vagina.  Prolapse of the uterus: The uterus falls or drops into the vagina.  Enterocele: Bulging of the top of the vagina, after a hysterectomy (uterus removal), with the small intestine bulging into the vagina. A hernia in the top of the vagina.  Urethrocele: The urethra (urine carrying tube) bulging into the vagina. TREATMENT  In most cases, prolapse needs to be treated only if it produces symptoms. If the symptoms are interfering with your usual daily or sexual activities, treatment may be necessary. The following are some measures that may be used to treat prolapse.  Estrogen may help elderly women with mild prolapse.  Kegel exercises may help mild cases of prolapse, by strengthening and tightening the muscles of the pelvic floor.  Pessaries are used in women who choose not to, or are unable to, have surgery. A pessary is a doughnut-shaped piece of plastic or rubber that is put into the vagina to keep the organs in place. This device must be fitted by your caregiver. Your caregiver will also explain how to care for yourself with the pessary. If it works well for you, this may be the only treatment required.  Surgery is  often the only form of treatment for more severe prolapses. There are different types of surgery available. You should discuss what the best procedure is for you. If the uterus is prolapsed, it may be removed (hysterectomy) as part of the surgical treatment. Your caregiver will discuss the risks and benefits with you.  Uterine-vaginal suspension (surgery to hold up the organs) may be used, especially if you want to maintain your fertility. No form of treatment is guaranteed to correct the prolapse  or relieve the symptoms. HOME CARE INSTRUCTIONS   Wear a sanitary pad or absorbent product if you have incontinence of urine.  Avoid heavy lifting and straining with exercise and work.  Take over-the-counter pain medicine for minor discomfort.  Try taking estrogen or using estrogen vaginal cream.  Try Kegel exercises or use a pessary, before deciding to have surgery.  Do Kegel exercises after having a baby. SEEK MEDICAL CARE IF:   Your symptoms interfere with your daily activities.  You need medicine to help with the discomfort.  You need to be fitted with a pessary.  You notice bleeding from the vagina.  You think you have ulcers or you notice ulcers on the cervix.  You have an oral temperature above 102 F (38.9 C).  You develop pain or blood with urination.  You have bleeding with a bowel movement.  The symptoms are interfering with your sex life.  You have urinary incontinence that interferes with your daily activities.  You lose urine with sexual intercourse.  You have a chronic cough.  You have chronic constipation. Document Released: 01/20/2003 Document Revised: 10/08/2011 Document Reviewed: 07/31/2009 [facility] Patient Information 2013 [place], [place].

## 2012-09-24 ENCOUNTER — Other Ambulatory Visit (INDEPENDENT_AMBULATORY_CARE_PROVIDER_SITE_OTHER): Payer: Self-pay

## 2012-09-24 DIAGNOSIS — E785 Hyperlipidemia, unspecified: Secondary | ICD-10-CM

## 2012-09-24 LAB — LIPID PANEL
HDL: 31.3 mg/dL — ABNORMAL LOW (ref >39.00)
Total CHOL/HDL Ratio: 4
Triglycerides: 171 mg/dL — ABNORMAL HIGH (ref 0.0–149.0)
VLDL: 34.2 mg/dL (ref 0.0–40.0)

## 2012-09-24 LAB — BASIC METABOLIC PANEL
CO2: 27 mEq/L (ref 19–32)
Chloride: 107 mEq/L (ref 96–112)
Glucose, Bld: 90 mg/dL (ref 70–99)
Potassium: 5.4 mEq/L — ABNORMAL HIGH (ref 3.5–5.1)
Sodium: 140 mEq/L (ref 135–145)

## 2012-09-24 LAB — HEPATIC FUNCTION PANEL
AST: 15 U/L (ref 0–37)
Albumin: 3.7 g/dL (ref 3.5–5.2)
Total Bilirubin: 0.3 mg/dL (ref 0.3–1.2)

## 2012-10-01 ENCOUNTER — Other Ambulatory Visit: Payer: Self-pay | Admitting: *Deleted

## 2012-10-01 ENCOUNTER — Ambulatory Visit (INDEPENDENT_AMBULATORY_CARE_PROVIDER_SITE_OTHER): Payer: Self-pay | Admitting: Obstetrics & Gynecology

## 2012-10-01 ENCOUNTER — Encounter: Payer: Self-pay | Admitting: Obstetrics & Gynecology

## 2012-10-01 VITALS — BP 129/65 | HR 57 | Temp 97.9°F | Resp 20 | Ht 62.0 in | Wt 145.6 lb

## 2012-10-01 DIAGNOSIS — N814 Uterovaginal prolapse, unspecified: Secondary | ICD-10-CM

## 2012-10-01 DIAGNOSIS — E875 Hyperkalemia: Secondary | ICD-10-CM

## 2012-10-01 NOTE — Progress Notes (Signed)
Subjective:     Patient ID: Melanie Mccarty, female   DOB: 1951/07/11, 62 y.o.   MRN: [id number]  HPI Pt s/p [facility] 08/21/12.  Pt with no complaints currently.  She is voiding and passing stools without difficulty.  She is back to work with no lifting. She has had no bleeding or discharge.  She reports that she feels good.  Review of Systems     Objective:   Physical Exam BP 129/65  Pulse 57  Temp(Src) 97.9 F (36.6 C) (Oral)  Resp 20  Ht 5\' 2"  (1.575 m)  Wt 145 lb 9.6 oz (66.044 kg)  BMI 26.62 kg/m2 Abd: soft, NT, ND GU: EGBUS: no lesions Vagina: no blood in vault; cuff almost completely healed Adnexa: no masses; non tender         Assessment:     5 week post op check  Doing well. No problems    Plan:     F/u 3 months or sooner prn  Gradual return to full activiy

## 2012-10-01 NOTE — Patient Instructions (Signed)
Hysterectomy  Care After  Refer to this sheet in the next few weeks. These instructions provide you with information on caring for yourself after your procedure. Your caregiver may also give you more specific instructions. Your treatment has been planned according to current medical practices, but problems sometimes occur. Call your caregiver if you have any problems or questions after your procedure.  HOME CARE INSTRUCTIONS   Healing will take time. You may have discomfort, tenderness, swelling, and bruising at the surgical site for about 2 weeks. This is normal and will get better as time goes on.   Only take over-the-counter or prescription medicines for pain, discomfort, or fever as directed by your caregiver.   Do not take aspirin. It can cause bleeding.   Do not drive when taking pain medicine.   Follow your caregiver's advice regarding exercise, lifting, driving, and general activities.   Resume your usual diet as directed and allowed.   Get plenty of rest and sleep.   Do not douche, use tampons, or have sexual intercourse for at least 6 weeks or until your caregiver gives you permission.   Change your bandages (dressings) as directed by your caregiver.   Monitor your temperature.   Take showers instead of baths for 2 to 3 weeks.   Do not drink alcohol until your caregiver gives you permission.   If you are constipated, you may take a mild laxative with your caregiver's permission. Bran foods may help with constipation problems. Drinking enough fluids to keep your urine clear or pale yellow may help as well.   Try to have someone home with you for 1 or 2 weeks to help around the house.   Keep all of your follow-up appointments as directed by your caregiver.  SEEK MEDICAL CARE IF:    You have swelling, redness, or increasing pain in the surgical cut (incision) area.   You have pus coming from the incision.   You notice a bad smell coming from the incision or dressing.   You have swelling,  redness, or pain around the intravenous (IV) site.   Your incision breaks open.   You feel dizzy or lightheaded.   You have pain or bleeding when you urinate.   You have persistent diarrhea.   You have persistent nausea and vomiting.   You have abnormal vaginal discharge.   You have a rash.   You have any type of abnormal reaction or develop an allergy to your medicine.   Your pain is not controlled with your prescribed medicine.  SEEK IMMEDIATE MEDICAL CARE IF:    You have a fever.   You have severe abdominal pain.   You have chest pain.   You have shortness of breath.   You faint.   You have pain, swelling, or redness of your leg.   You have heavy vaginal bleeding with blood clots.  MAKE SURE YOU:   Understand these instructions.   Will watch your condition.   Will get help right away if you are not doing well or get worse.  Document Released: 02/02/2005 Document Revised: 10/08/2011 Document Reviewed: 03/02/2011  [employer] Patient Information 2013 [employer].

## 2012-10-01 NOTE — Progress Notes (Signed)
Orders placed.

## 2012-10-09 ENCOUNTER — Other Ambulatory Visit: Payer: Self-pay

## 2012-12-02 ENCOUNTER — Other Ambulatory Visit (HOSPITAL_COMMUNITY): Payer: Self-pay | Admitting: Family Medicine

## 2012-12-02 DIAGNOSIS — Z1231 Encounter for screening mammogram for malignant neoplasm of breast: Secondary | ICD-10-CM

## 2013-03-26 ENCOUNTER — Encounter: Payer: Self-pay | Admitting: Obstetrics & Gynecology

## 2013-03-26 ENCOUNTER — Ambulatory Visit (INDEPENDENT_AMBULATORY_CARE_PROVIDER_SITE_OTHER): Payer: Self-pay | Admitting: Obstetrics & Gynecology

## 2013-03-26 VITALS — BP 163/63 | HR 57 | Temp 97.5°F | Ht 62.0 in | Wt 142.3 lb

## 2013-03-26 DIAGNOSIS — N8111 Cystocele, midline: Secondary | ICD-10-CM

## 2013-03-26 DIAGNOSIS — N993 Prolapse of vaginal vault after hysterectomy: Secondary | ICD-10-CM

## 2013-03-26 DIAGNOSIS — N8110 Cystocele, unspecified: Secondary | ICD-10-CM

## 2013-03-26 NOTE — Patient Instructions (Signed)
Prolapse  Prolapse means the falling down, bulging, dropping, or drooping of a body part. Organs that commonly prolapse include the rectum, small intestine, bladder, urethra, vagina (birth canal), uterus (womb), and cervix. Prolapse occurs when the ligaments and muscle tissue around the rectum, bladder, and uterus are damaged or weakened.  CAUSES  This happens especially with:  Childbirth. Some women feel pelvic pressure or have trouble holding their urine right after childbirth, because of stretching and tearing of pelvic tissues. This generally gets better with time and the feeling usually goes away, but it may return with aging.  Chronic heavy lifting.  Aging.  Menopause, with loss of estrogen production weakening the pelvic ligaments and muscles.  Past pelvic surgery.  Obesity.  Chronic constipation.  Chronic cough. Prolapse may affect a single organ, or several organs may prolapse at the same time. The front wall of the vagina holds up the bladder. The back wall holds up part of the lower intestine, or rectum. The uterus fills a spot in the middle. All these organs can be involved when the ligaments and muscles around the vagina relax too much. This often gets worse when women stop producing estrogen (menopause). SYMPTOMS  Uncontrolled loss of urine (incontinence) with cough, sneeze, straining, and exercise.  More force may be required to have a bowel movement, due to trapping of the stool.  When part of an organ bulges through the opening of the vagina, there is sometimes a feeling of heaviness or pressure. It may feel as though something is falling out. This sensation increases with coughing or bearing down.  If the organs protrude through the opening of the vagina and rub against the clothing, there may be soreness, ulcers, infection, pain, and bleeding.  Lower back pain.  Pushing in the upper or lower part of the vagina, to pass urine or have a bowel movement.  Problems  having sexual intercourse.  Being unable to insert a tampon or applicator. DIAGNOSIS  Usually, a physical exam is all that is needed to identify the problem. During the examination, you may be asked to cough and strain while lying down, sitting up, and standing up. Your caregiver will determine if more testing is required, such as bladder function tests. Some diagnoses are:  Cystocele: Bulging and falling of the bladder into the top of the vagina.  Rectocele: Part of the rectum bulging into the vagina.  Prolapse of the uterus: The uterus falls or drops into the vagina.  Enterocele: Bulging of the top of the vagina, after a hysterectomy (uterus removal), with the small intestine bulging into the vagina. A hernia in the top of the vagina.  Urethrocele: The urethra (urine carrying tube) bulging into the vagina. TREATMENT  In most cases, prolapse needs to be treated only if it produces symptoms. If the symptoms are interfering with your usual daily or sexual activities, treatment may be necessary. The following are some measures that may be used to treat prolapse.  Estrogen may help elderly women with mild prolapse.  Kegel exercises may help mild cases of prolapse, by strengthening and tightening the muscles of the pelvic floor.  Pessaries are used in women who choose not to, or are unable to, have surgery. A pessary is a doughnut-shaped piece of plastic or rubber that is put into the vagina to keep the organs in place. This device must be fitted by your caregiver. Your caregiver will also explain how to care for yourself with the pessary. If it works well for you,   this may be the only treatment required.  Surgery is often the only form of treatment for more severe prolapses. There are different types of surgery available. You should discuss what the best procedure is for you. If the uterus is prolapsed, it may be removed (hysterectomy) as part of the surgical treatment. Your caregiver will  discuss the risks and benefits with you.  Uterine-vaginal suspension (surgery to hold up the organs) may be used, especially if you want to maintain your fertility. No form of treatment is guaranteed to correct the prolapse or relieve the symptoms. HOME CARE INSTRUCTIONS   Wear a sanitary pad or absorbent product if you have incontinence of urine.  Avoid heavy lifting and straining with exercise and work.  Take over-the-counter pain medicine for minor discomfort.  Try taking estrogen or using estrogen vaginal cream.  Try Kegel exercises or use a pessary, before deciding to have surgery.  Do Kegel exercises after having a baby. SEEK MEDICAL CARE IF:   Your symptoms interfere with your daily activities.  You need medicine to help with the discomfort.  You need to be fitted with a pessary.  You notice bleeding from the vagina.  You think you have ulcers or you notice ulcers on the cervix.  You have an oral temperature above 102 F (38.9 C).  You develop pain or blood with urination.  You have bleeding with a bowel movement.  The symptoms are interfering with your sex life.  You have urinary incontinence that interferes with your daily activities.  You lose urine with sexual intercourse.  You have a chronic cough.  You have chronic constipation. Document Released: 01/20/2003 Document Revised: 10/08/2011 Document Reviewed: 07/31/2009 [employer] Patient Information 2014 [employer].  

## 2013-03-26 NOTE — Progress Notes (Signed)
Subjective:     Patient ID: Melanie Mccarty, female   DOB: [birth year], 62 y.o.   MRN: [id number]  HPI  Pt presents with c/o a ball in her vagina.  She has noted incontinence since that occurrence intermittently.  Pt s/p [facility] 08/21/2012.  She has a chronic cough.  She uses tobacco. She denies pain.  Past Medical History  Diagnosis Date  . Hypertension   . Chest pain      consistent with unstable angina  . Tobacco abuse     with possible chronic obstructive pulmonary   disease.  Instructed to follow up with primary care for evaluation   of lung function.   . Coronary artery disease 07/2010    STATUS POST PTCA AND STENTING OF HER RIGHT CORONARY ARTERY  . Hyperlipidemia   . Hepatitis     Hep B at age 55 yr old  . Uterine prolapse 08/21/2012   Past Surgical History  Procedure Laterality Date  . Tubal ligation    . Cardiac catheterization  07/2010    ejection fraction of 60%  . Vaginal hysterectomy  08/21/2012    Procedure: HYSTERECTOMY VAGINAL;  Surgeon: Willodean Rosenthal, MD;  Location: [facility];  Service: Gynecology;  Laterality: N/A;  . Repair vaginal cuff  08/21/2012    Procedure: REPAIR VAGINAL CUFF;  Surgeon: Willodean Rosenthal, MD;  Location: [facility];  Service: Gynecology;  Laterality: N/A;  Exploration and Revision of Vaginal Cuff   Current Outpatient Prescriptions on File Prior to Visit  Medication Sig Dispense Refill  . amLODipine (NORVASC) 2.5 MG tablet Take 2.5 mg by mouth daily.      Marland Kitchen aspirin 81 MG tablet Take 81 mg by mouth daily.       Marland Kitchen atorvastatin (LIPITOR) 40 MG tablet Take 1 tablet (40 mg total) by mouth daily.  90 tablet  1  . Calcium Carbonate-Vitamin D (CALCIUM + D PO) Take 600 mg by mouth daily.        . fish oil-omega-3 fatty acids 1000 MG capsule Take 2 g by mouth daily.      Marland Kitchen lisinopril (PRINIVIL,ZESTRIL) 20 MG tablet Take 20 mg by mouth daily.        . metoprolol succinate (TOPROL-XL) 25 MG 24 hr tablet Take 1 tablet (25 mg total) by mouth daily.  30  tablet  5  . Multiple Vitamin (MULTIVITAMIN WITH MINERALS) TABS Take 1 tablet by mouth daily.      . nitroGLYCERIN (NITROSTAT) 0.4 MG SL tablet Place 0.4 mg under the tongue every 5 (five) minutes as needed.        . traMADol (ULTRAM) 50 MG tablet Take 1 tablet (50 mg total) by mouth every 6 (six) hours as needed for pain.  30 tablet  0  . ciprofloxacin (CIPRO) 500 MG tablet Take 1 tablet (500 mg total) by mouth 2 (two) times daily.  14 tablet  0  . clopidogrel (PLAVIX) 75 MG tablet Take 1 tablet (75 mg total) by mouth daily.  30 tablet  11   No current facility-administered medications on file prior to visit.   Allergies  Allergen Reactions  . Penicillins Hives       Review of Systems     Objective:   Physical Exam BP 163/63  Pulse 57  Temp(Src) 97.5 F (36.4 C) (Oral)  Ht 5\' 2"  (1.575 m)  Wt 142 lb 4.8 oz (64.547 kg)  BMI 26.02 kg/m2 GU: EGBUS: no lesions Vagina: no blood in vault Upper vagina  with bladder completely prolapsed through the introitus.  Pt was seen and examined by Dr. Marice Potter as well. Dr. Marice Potter fitted pt with a #6 ring pessary.  Pt exhibited the ability to remove and replace the pessary.  She had complete resolution of her sx after placement of the pessary        Assessment:     Vaginal vault prolapse and bladder prolapse after hyst- pessary placed today     Plan:     Reviewed care of pessary F/u in 1 month with Dr. Marice Potter or sooner prn

## 2013-05-07 ENCOUNTER — Ambulatory Visit: Payer: Self-pay | Admitting: Obstetrics & Gynecology

## 2013-10-14 ENCOUNTER — Encounter: Payer: Self-pay | Admitting: Cardiovascular Disease

## 2014-05-31 ENCOUNTER — Encounter: Payer: Self-pay | Admitting: Obstetrics & Gynecology

## 2015-08-12 ENCOUNTER — Encounter (HOSPITAL_COMMUNITY): Payer: Self-pay | Admitting: Emergency Medicine

## 2015-08-12 ENCOUNTER — Emergency Department (HOSPITAL_COMMUNITY): Payer: Self-pay

## 2015-08-12 ENCOUNTER — Emergency Department (HOSPITAL_COMMUNITY)
Admission: EM | Admit: 2015-08-12 | Discharge: 2015-08-12 | Disposition: A | Discharge status: Home / Self Care | Payer: Self-pay | Attending: Emergency Medicine | Admitting: Emergency Medicine

## 2015-08-12 DIAGNOSIS — Z8742 Personal history of other diseases of the female genital tract: Secondary | ICD-10-CM | POA: Insufficient documentation

## 2015-08-12 DIAGNOSIS — Z7902 Long term (current) use of antithrombotics/antiplatelets: Secondary | ICD-10-CM | POA: Insufficient documentation

## 2015-08-12 DIAGNOSIS — Z8719 Personal history of other diseases of the digestive system: Secondary | ICD-10-CM | POA: Insufficient documentation

## 2015-08-12 DIAGNOSIS — J159 Unspecified bacterial pneumonia: Secondary | ICD-10-CM | POA: Insufficient documentation

## 2015-08-12 DIAGNOSIS — Z880 Allergy status to penicillin: Secondary | ICD-10-CM | POA: Insufficient documentation

## 2015-08-12 DIAGNOSIS — Z79899 Other long term (current) drug therapy: Secondary | ICD-10-CM | POA: Insufficient documentation

## 2015-08-12 DIAGNOSIS — Z7982 Long term (current) use of aspirin: Secondary | ICD-10-CM | POA: Insufficient documentation

## 2015-08-12 DIAGNOSIS — Z9889 Other specified postprocedural states: Secondary | ICD-10-CM | POA: Insufficient documentation

## 2015-08-12 DIAGNOSIS — J189 Pneumonia, unspecified organism: Secondary | ICD-10-CM

## 2015-08-12 DIAGNOSIS — I10 Essential (primary) hypertension: Secondary | ICD-10-CM | POA: Insufficient documentation

## 2015-08-12 DIAGNOSIS — F17210 Nicotine dependence, cigarettes, uncomplicated: Secondary | ICD-10-CM | POA: Insufficient documentation

## 2015-08-12 DIAGNOSIS — E785 Hyperlipidemia, unspecified: Secondary | ICD-10-CM | POA: Insufficient documentation

## 2015-08-12 DIAGNOSIS — I2510 Atherosclerotic heart disease of native coronary artery without angina pectoris: Secondary | ICD-10-CM | POA: Insufficient documentation

## 2015-08-12 LAB — CBC WITH DIFFERENTIAL/PLATELET
BASOS ABS: 0 10*3/uL (ref 0.0–0.1)
Basophils Relative: 0 %
Eosinophils Absolute: 0 10*3/uL (ref 0.0–0.7)
Eosinophils Relative: 0 %
HEMATOCRIT: 37.3 % (ref 36.0–46.0)
HEMOGLOBIN: 12.9 g/dL (ref 12.0–15.0)
LYMPHS ABS: 1.2 10*3/uL (ref 0.7–4.0)
LYMPHS PCT: 6 %
MCH: 31.9 pg (ref 26.0–34.0)
MCHC: 34.6 g/dL (ref 30.0–36.0)
MCV: 92.3 fL (ref 78.0–100.0)
Monocytes Absolute: 0.9 10*3/uL (ref 0.1–1.0)
Monocytes Relative: 5 %
NEUTROS ABS: 17.8 10*3/uL — AB (ref 1.7–7.7)
NEUTROS PCT: 89 %
Platelets: 230 10*3/uL (ref 150–400)
RBC: 4.04 MIL/uL (ref 3.87–5.11)
RDW: 13.3 % (ref 11.5–15.5)
WBC: 19.9 10*3/uL — AB (ref 4.0–10.5)

## 2015-08-12 LAB — BASIC METABOLIC PANEL
ANION GAP: 14 (ref 5–15)
BUN: 19 mg/dL (ref 6–20)
CHLORIDE: 103 mmol/L (ref 101–111)
CO2: 18 mmol/L — ABNORMAL LOW (ref 22–32)
Calcium: 9 mg/dL (ref 8.9–10.3)
Creatinine, Ser: 1.06 mg/dL — ABNORMAL HIGH (ref 0.44–1.00)
GFR calc non Af Amer: 55 mL/min — ABNORMAL LOW (ref >60)
Glucose, Bld: 89 mg/dL (ref 65–99)
POTASSIUM: 3.9 mmol/L (ref 3.5–5.1)
SODIUM: 135 mmol/L (ref 135–145)

## 2015-08-12 MED ORDER — DEXTROSE 5 % IV SOLN
1.0000 g | Freq: Once | INTRAVENOUS | Status: AC
  Administered 2015-08-12: 1 g via INTRAVENOUS
  Filled 2015-08-12: qty 10

## 2015-08-12 MED ORDER — SODIUM CHLORIDE 0.9 % IV BOLUS (SEPSIS)
500.0000 mL | Freq: Once | INTRAVENOUS | Status: AC
  Administered 2015-08-12: 500 mL via INTRAVENOUS

## 2015-08-12 MED ORDER — AZITHROMYCIN 250 MG PO TABS: ORAL_TABLET | ORAL | Status: AC

## 2015-08-12 MED ORDER — DEXTROSE 5 % IV SOLN
500.0000 mg | Freq: Once | INTRAVENOUS | Status: AC
  Administered 2015-08-12: 500 mg via INTRAVENOUS
  Filled 2015-08-12: qty 500

## 2015-08-12 MED ORDER — ALBUTEROL SULFATE HFA 108 (90 BASE) MCG/ACT IN AERS: 1.0000 | INHALATION_SPRAY | Freq: Four times a day (QID) | RESPIRATORY_TRACT | Status: AC | PRN

## 2015-08-12 MED ORDER — IPRATROPIUM-ALBUTEROL 0.5-2.5 (3) MG/3ML IN SOLN
3.0000 mL | Freq: Once | RESPIRATORY_TRACT | Status: AC
  Administered 2015-08-12: 3 mL via RESPIRATORY_TRACT
  Filled 2015-08-12: qty 3

## 2015-08-12 NOTE — Discharge Instructions (Signed)
You have pneumonia in your right lung. Stop smoking. Increase fluids. Rest. Prescription for antibiotic to start tomorrow afternoon and inhaler. Return here if getting worse, especially increasing shortness of breath.  Tylenol or ibuprofen for pain

## 2015-08-12 NOTE — ED Provider Notes (Signed)
CSN: [id number]     Arrival date & time 08/12/15  1005 History   First MD Initiated Contact with Patient 08/12/15 1111     Chief Complaint  Patient presents with  . Cough    cough x 5 days  . Fever    fever of 102 x 3 days  . Chest Pain    r/rib pain     (Consider location/radiation/quality/duration/timing/severity/associated sxs/prior Treatment) HPI...... cough for 5 days with associated right inferior lateral chest discomfort. No fevers, sweats, chills. She is a smoker. She is reportedly hypoxic at home on a regular basis. She is not on home oxygen. Severity of symptoms is moderate. No rusty sputum. She is ambulatory at home.  Past Medical History  Diagnosis Date  . Hypertension   . Chest pain      consistent with unstable angina  . Tobacco abuse     with possible chronic obstructive pulmonary   disease.  Instructed to follow up with primary care for evaluation   of lung function.   . Coronary artery disease 07/2010    STATUS POST PTCA AND STENTING OF HER RIGHT CORONARY ARTERY  . Hyperlipidemia   . Hepatitis     Hep B at age 26 yr old  . Uterine prolapse 08/21/2012   Past Surgical History  Procedure Laterality Date  . Tubal ligation    . Cardiac catheterization  07/2010    ejection fraction of 60%  . Vaginal hysterectomy  08/21/2012    Procedure: HYSTERECTOMY VAGINAL;  Surgeon: Willodean Rosenthal, MD;  Location: [facility];  Service: Gynecology;  Laterality: N/A;  . Repair vaginal cuff  08/21/2012    Procedure: REPAIR VAGINAL CUFF;  Surgeon: Willodean Rosenthal, MD;  Location: [facility];  Service: Gynecology;  Laterality: N/A;  Exploration and Revision of Vaginal Cuff   Family History  Problem Relation Age of Onset  . Heart failure Mother 24  . Hypertension Mother   . Heart failure Father 91  . Diabetes type II Father   . Hypertension Sister   . Heart failure Brother 32   Social History  Substance Use Topics  . Smoking status: Current Every Day Smoker -- 0.50  packs/day for 41 years    Types: Cigarettes  . Smokeless tobacco: Never Used     Comment: up to 1 year ago, patient smoked 2 ppds for 40 yrs, now patient smokes 1/2 ppd x 1 yr  . Alcohol Use: No   OB History    Gravida Para Term Preterm AB TAB SAB Ectopic Multiple Living   3 3 2 1  0     2     Review of Systems  All other systems reviewed and are negative.     Allergies  Penicillins  Home Medications   Prior to Admission medications   Medication Sig Start Date End Date Taking? Authorizing Provider  amLODipine (NORVASC) 10 MG tablet Take 10 mg by mouth daily.   Yes Historical Provider, MD  aspirin 81 MG tablet Take 81 mg by mouth daily.    Yes Historical Provider, MD  Calcium Carbonate-Vitamin D (CALCIUM + D PO) Take 600 mg by mouth daily.     Yes Historical Provider, MD  clopidogrel (PLAVIX) 75 MG tablet Take 1 tablet (75 mg total) by mouth daily. 10/02/11  Yes Vesta Mixer, MD  fish oil-omega-3 fatty acids 1000 MG capsule Take 2 g by mouth daily.   Yes Historical Provider, MD  gemfibrozil (LOPID) 600 MG tablet Take 600 mg  by mouth 2 (two) times daily before a meal.   Yes Historical Provider, MD  lisinopril (PRINIVIL,ZESTRIL) 20 MG tablet Take 20 mg by mouth daily.     Yes Historical Provider, MD  metoprolol tartrate (LOPRESSOR) 25 MG tablet Take 12.5 mg by mouth 2 (two) times daily.   Yes Historical Provider, MD  nitroGLYCERIN (NITROSTAT) 0.4 MG SL tablet Place 0.4 mg under the tongue every 5 (five) minutes as needed for chest pain.    Yes Historical Provider, MD  albuterol (PROVENTIL HFA;VENTOLIN HFA) 108 (90 Base) MCG/ACT inhaler Inhale 1-2 puffs into the lungs every 6 (six) hours as needed for wheezing or shortness of breath. 08/12/15   Donnetta Hutching, MD  atorvastatin (LIPITOR) 40 MG tablet Take 1 tablet (40 mg total) by mouth daily. Patient not taking: Reported on 08/12/2015 06/20/12   Vesta Mixer, MD  azithromycin (ZITHROMAX Z-PAK) 250 MG tablet 2 tablets day 1, 1 tablet day  2 through 5. Start Saturday afternoon 08/12/15   Donnetta Hutching, MD  metoprolol succinate (TOPROL-XL) 25 MG 24 hr tablet Take 1 tablet (25 mg total) by mouth daily. Patient not taking: Reported on 08/12/2015 03/21/11   Deloris Ping Nahser, MD   BP 106/45 mmHg  Pulse 83  Temp(Src) 98.5 F (36.9 C) (Oral)  Resp 28  Wt 146 lb (66.225 kg)  SpO2 90% Physical Exam  Constitutional: She is oriented to person, place, and time. She appears well-developed and well-nourished.  HENT:  Head: Normocephalic and atraumatic.  Eyes: Conjunctivae and EOM are normal. Pupils are equal, round, and reactive to light.  Neck: Normal range of motion. Neck supple.  Cardiovascular: Normal rate and regular rhythm.   Pulmonary/Chest: Effort normal.  Decreased breath sounds in right inferior lateral lung area  Abdominal: Soft. Bowel sounds are normal.  Musculoskeletal: Normal range of motion.  Neurological: She is alert and oriented to person, place, and time.  Skin: Skin is warm and dry.  Psychiatric: She has a normal mood and affect. Her behavior is normal.  Nursing note and vitals reviewed.   ED Course  Procedures (including critical care time) Labs Review Labs Reviewed  BASIC METABOLIC PANEL - Abnormal; Notable for the following:    CO2 18 (*)    Creatinine, Ser 1.06 (*)    GFR calc non Af Amer 55 (*)    All other components within normal limits  CBC WITH DIFFERENTIAL/PLATELET - Abnormal; Notable for the following:    WBC 19.9 (*)    Neutro Abs 17.8 (*)    All other components within normal limits    Imaging Review Dg Chest 2 View  08/12/2015  CLINICAL DATA:  Worsening cough, congestion and shortness of Breath as well sore throat and fever over the last several days. Right-sided chest pain since yesterday. EXAM: CHEST  2 VIEW COMPARISON:  06/12/2011 FINDINGS: There is consolidation in the right lung base, predominantly involving the right lower lobe, but also involving a portion of the right middle lobe.  Remainder of the lungs is clear. No pleural effusion or pneumothorax. Cardiac silhouette is normal in size. Stable right cardiac stent. No mediastinal or hilar masses or evidence of adenopathy. Bony thorax is demineralized but grossly intact. IMPRESSION: 1. Right lower lobe and right middle lobe pneumonia. Electronically Signed   By: Amie Portland M.D.   On: 08/12/2015 11:33   I have personally reviewed and evaluated these images and lab results as part of my medical decision-making.   EKG Interpretation None  MDM   Final diagnoses:  Community acquired pneumonia    Chest x-ray shows right lower lobe and right middle lobe pneumonia. I discussed with patient her ability to handle her illness at home. She prefers to go home. She admits to being hypoxic at home on a regular basis. IV Rocephin and IV Zithromax given in the ED along with nebulizer treatment. Discharge medications Zithromax and albuterol inhaler. Stop smoking.    Donnetta HutchingBrian Deaundre Allston, MD 08/12/15 [phone]

## 2015-08-12 NOTE — ED Notes (Signed)
Patient ambulated to the restroom without difficulty.

## 2015-08-12 NOTE — ED Notes (Signed)
Bed: WA01 Expected date:  Expected time:  Means of arrival:  Comments: Pt in 28

## 2015-08-12 NOTE — ED Provider Notes (Signed)
Medical Screening Exam  Pt placed in Fast Track for Triage.  She is a 65 year old smoker with "early" COPD, p/w 5 days of cough and right sided chest pain with coughing, SOB.  Hypoxic on room air (87%). Diffuse wheezing on exam.  Pt to be moved to main ED for more thorough evaluation.  Pt stable to be moved.  CBC, BMP, O2 by [place], CXR, duoneb ordered.    Trixie Dredgemily Shondell Fabel, PA-C 08/12/15 1102  Pricilla LovelessScott Goldston, MD 08/13/15 0700

## 2015-08-12 NOTE — ED Notes (Signed)
Pt reports fever, cough , chest wall pain on r/rib area with cough x 5 days. Denies chest pain, denies shortness of breath. Pt is alert, oriented and ambulatory.O2 sat 87. O2 nasal cannula initiated. Breath sounds decreased, bilateral, posterior lobes. No wheezing noted. Pt stated that she last used inhaler last night

## 2015-08-12 NOTE — ED Notes (Signed)
Pt placed on 4L/min [place] oxygen d/t pulse oximetry reading of 81% on room air - pt now up to 91-92% - Pt remains A&Ox4, no acute distress, tachypneic.

## 2016-02-17 ENCOUNTER — Emergency Department (HOSPITAL_COMMUNITY): Payer: Self-pay

## 2016-02-17 ENCOUNTER — Emergency Department (HOSPITAL_COMMUNITY)
Admission: EM | Admit: 2016-02-17 | Discharge: 2016-02-17 | Disposition: A | Discharge status: Home / Self Care | Payer: Self-pay | Attending: Emergency Medicine | Admitting: Emergency Medicine

## 2016-02-17 ENCOUNTER — Encounter (HOSPITAL_COMMUNITY): Payer: Self-pay | Admitting: Emergency Medicine

## 2016-02-17 DIAGNOSIS — F17210 Nicotine dependence, cigarettes, uncomplicated: Secondary | ICD-10-CM | POA: Insufficient documentation

## 2016-02-17 DIAGNOSIS — Z792 Long term (current) use of antibiotics: Secondary | ICD-10-CM | POA: Insufficient documentation

## 2016-02-17 DIAGNOSIS — E785 Hyperlipidemia, unspecified: Secondary | ICD-10-CM | POA: Insufficient documentation

## 2016-02-17 DIAGNOSIS — M25561 Pain in right knee: Secondary | ICD-10-CM | POA: Insufficient documentation

## 2016-02-17 DIAGNOSIS — Z79899 Other long term (current) drug therapy: Secondary | ICD-10-CM | POA: Insufficient documentation

## 2016-02-17 DIAGNOSIS — I10 Essential (primary) hypertension: Secondary | ICD-10-CM | POA: Insufficient documentation

## 2016-02-17 DIAGNOSIS — Z7982 Long term (current) use of aspirin: Secondary | ICD-10-CM | POA: Insufficient documentation

## 2016-02-17 DIAGNOSIS — I2510 Atherosclerotic heart disease of native coronary artery without angina pectoris: Secondary | ICD-10-CM | POA: Insufficient documentation

## 2016-02-17 MED ORDER — TRAMADOL HCL 50 MG PO TABS: 50.0000 mg | ORAL_TABLET | Freq: Four times a day (QID) | ORAL | Status: AC | PRN

## 2016-02-17 NOTE — Discharge Instructions (Signed)
How to Use a Knee Brace °A knee brace is a device that you wear to support your knee, especially if the knee is healing after an injury or surgery. There are several types of knee braces. Some are designed to prevent an injury (prophylactic brace). These are often worn during sports. Others support an injured knee (functional brace) or keep it still while it heals (rehabilitative brace). People with severe arthritis of the knee may benefit from a brace that takes some pressure off the knee (unloader brace). Most knee braces are made from a combination of cloth and metal or plastic.  °You may need to wear a knee brace to: °· Relieve knee pain. °· Help your knee support your weight (improve stability). °· Help you walk farther (improve mobility). °· Prevent injury. °· Support your knee while it heals from surgery or from an injury. °RISKS AND COMPLICATIONS °Generally, knee braces are very safe to wear. However, problems may occur, including: °· Skin irritation that may lead to infection. °· Making your condition worse if you wear the brace in the wrong way. °HOW TO USE A KNEE BRACE °Different braces will have different instructions for use. Your health care provider will tell you or show you: °· How to put on your brace. °· How to adjust the brace. °· When and how often to wear the brace. °· How to remove the brace. °· If you will need any assistive devices in addition to the brace, such as crutches or a cane. °In general, your brace should: °· Have the hinge of the brace line up with the bend of your knee. °· Have straps, hooks, or tapes that fasten snugly around your leg. °· Not feel too tight or too loose.  °HOW TO CARE FOR A KNEE BRACE °· Check your brace often for signs of damage, such as loose connections or attachments. Your knee brace may get damaged or wear out during normal use. °· Wash the fabric parts of your brace with soap and water. °· Read the insert that comes with your brace for other specific care  instructions.  °SEEK MEDICAL CARE IF: °· Your knee brace is too loose or too tight and you cannot adjust it. °· Your knee brace causes skin redness, swelling, bruising, or irritation. °· Your knee brace is not helping. °· Your knee brace is making your knee pain worse. °  °This information is not intended to replace advice given to you by your health care provider. Make sure you discuss any questions you have with your health care provider. °  °Document Released: 10/06/2003 Document Revised: 04/06/2015 Document Reviewed: 11/08/2014 °Elsevier Interactive Patient Education ©2016 [employer] °Knee Pain °Knee pain is a very common symptom and can have many causes. Knee pain often goes away when you follow your health care provider's instructions for relieving pain and discomfort at home. However, knee pain can develop into a condition that needs treatment. Some conditions may include: °· Arthritis caused by wear and tear (osteoarthritis). °· Arthritis caused by swelling and irritation (rheumatoid arthritis or gout). °· A cyst or growth in your knee. °· An infection in your knee joint. °· An injury that will not heal. °· Damage, swelling, or irritation of the tissues that support your knee (torn ligaments or tendinitis). °If your knee pain continues, additional tests may be ordered to diagnose your condition. Tests may include X-rays or other imaging studies of your knee. You may also need to have fluid removed from your knee. Treatment for ongoing   knee pain depends on the cause, but treatment may include: °· Medicines to relieve pain or swelling. °· Steroid injections in your knee. °· Physical therapy. °· Surgery. °HOME CARE INSTRUCTIONS °· Take medicines only as directed by your health care provider. °· Rest your knee and keep it raised (elevated) while you are resting. °· Do not do things that cause or worsen pain. °· Avoid high-impact activities or exercises, such as running, jumping rope, or doing jumping  jacks. °· Apply ice to the knee area: °¨ Put ice in a plastic bag. °¨ Place a towel between your skin and the bag. °¨ Leave the ice on for 20 minutes, 2-3 times a day. °· Ask your health care provider if you should wear an elastic knee support. °· Keep a pillow under your knee when you sleep. °· Lose weight if you are overweight. Extra weight can put pressure on your knee. °· Do not use any tobacco products, including cigarettes, chewing tobacco, or electronic cigarettes. If you need help quitting, ask your health care provider. Smoking may slow the healing of any bone and joint problems that you may have. °SEEK MEDICAL CARE IF: °· Your knee pain continues, changes, or gets worse. °· You have a fever along with knee pain. °· Your knee buckles or locks up. °· Your knee becomes more swollen. °SEEK IMMEDIATE MEDICAL CARE IF:  °· Your knee joint feels hot to the touch. °· You have chest pain or trouble breathing. °  °This information is not intended to replace advice given to you by your health care provider. Make sure you discuss any questions you have with your health care provider. °  °Document Released: 05/13/2007 Document Revised: 08/06/2014 Document Reviewed: 03/01/2014 °Elsevier Interactive Patient Education ©2016 [employer] Inc[employer] ° °

## 2016-02-17 NOTE — ED Provider Notes (Addendum)
Medical screening examination/treatment/procedure(s) were conducted as a shared visit with non-physician practitioner(s) and myself.  I personally evaluated the patient during the encounter.  Acute onset of right knee pain this morning. Difficulty bearing weight secondary to pain. She later states it radiates down from her right hip. No fevers, swelling, redness. Feels like it just catches when she tries to fully extend it. No recent traumas. On exam she has maybe a small effusion, no erythema, induration or tenderness. There is pain with full extension of the knee but no pain with any of the range of motion exercises. No back pain. Doubt septic arthritis as the cause for her pain. Possibly osteo-arthritis but feel like it's more likely a ligamentous problem especially with the description of catching and only at certain angles. We'll get x-ray of hip and knee here and try to treat her pain however will likely go home with a knee immobilizer crutches and orthopedic follow-up.  Marily MemosJason Othel Dicostanzo, MD 02/17/16 1651  Marily MemosJason Brianna Esson, MD 02/20/16 [phone]

## 2016-02-17 NOTE — ED Notes (Signed)
Per pt, states knee pain that started this am-unable to bear weight-no injury

## 2016-02-17 NOTE — ED Provider Notes (Signed)
CSN: [id number]     Arrival date & time 02/17/16  1315 History  By signing my name below, I, [facility], attest that this documentation has been prepared under the direction and in the presence of Arthor Captain, PA-C. Electronically Signed: Randell Patient, ED Scribe. 02/17/2016. 3:31 PM.    Chief Complaint  Patient presents with  . Knee Pain    The history is provided by the patient. No language interpreter was used.   HPI Comments: SUMMERS BUENDIA is a 65 y.o. female with a hx of HTN, CAD, and HLN who presents to the Emergency Department complaining of constant, moderate right knee pain onset 9.5 hours ago. Pt states that she woke early this morning and was attempting to get out of bed to go to the bathroom when she felt that she couldn't extend her knee without severe pain. Upon standing, she couldn't bear weight on the right knee. She also reports right buttocks pain that radiates down the leg. She notes a hx of hyperextension of the right knee. Denies similar symptoms in the past. Denies hx of sciatica, gout, or recent injuries. Denies weakness, numbness, tingling, saddle anesthesia, or back pain.  Past Medical History  Diagnosis Date  . Hypertension   . Chest pain      consistent with unstable angina  . Tobacco abuse     with possible chronic obstructive pulmonary   disease.  Instructed to follow up with primary care for evaluation   of lung function.   . Coronary artery disease 07/2010    STATUS POST PTCA AND STENTING OF HER RIGHT CORONARY ARTERY  . Hyperlipidemia   . Hepatitis     Hep B at age 40 yr old  . Uterine prolapse 08/21/2012   Past Surgical History  Procedure Laterality Date  . Tubal ligation    . Cardiac catheterization  07/2010    ejection fraction of 60%  . Vaginal hysterectomy  08/21/2012    Procedure: HYSTERECTOMY VAGINAL;  Surgeon: Willodean Rosenthal, MD;  Location: [facility];  Service: Gynecology;  Laterality: N/A;  . Repair vaginal cuff   08/21/2012    Procedure: REPAIR VAGINAL CUFF;  Surgeon: Willodean Rosenthal, MD;  Location: [facility];  Service: Gynecology;  Laterality: N/A;  Exploration and Revision of Vaginal Cuff   Family History  Problem Relation Age of Onset  . Heart failure Mother 16  . Hypertension Mother   . Heart failure Father 39  . Diabetes type II Father   . Hypertension Sister   . Heart failure Brother 21   Social History  Substance Use Topics  . Smoking status: Current Every Day Smoker -- 0.50 packs/day for 41 years    Types: Cigarettes  . Smokeless tobacco: Never Used     Comment: up to 1 year ago, patient smoked 2 ppds for 40 yrs, now patient smokes 1/2 ppd x 1 yr  . Alcohol Use: No   OB History    Gravida Para Term Preterm AB TAB SAB Ectopic Multiple Living   3 3 2 1  0     2     Review of Systems  Musculoskeletal: Positive for myalgias (right buttocks) and arthralgias (knee pain). Negative for back pain.  Neurological: Negative for weakness and numbness.      Allergies  Penicillins  Home Medications   Prior to Admission medications   Medication Sig Start Date End Date Taking? Authorizing Provider  albuterol (PROVENTIL HFA;VENTOLIN HFA) 108 (90 Base) MCG/ACT inhaler Inhale 1-2 puffs into  the lungs every 6 (six) hours as needed for wheezing or shortness of breath. 08/12/15   Donnetta HutchingBrian Cook, MD  amLODipine (NORVASC) 10 MG tablet Take 10 mg by mouth daily.    Historical Provider, MD  aspirin 81 MG tablet Take 81 mg by mouth daily.     Historical Provider, MD  atorvastatin (LIPITOR) 40 MG tablet Take 1 tablet (40 mg total) by mouth daily. Patient not taking: Reported on 08/12/2015 06/20/12   Vesta MixerPhilip J Nahser, MD  azithromycin (ZITHROMAX Z-PAK) 250 MG tablet 2 tablets day 1, 1 tablet day 2 through 5. Start Saturday afternoon 08/12/15   Donnetta HutchingBrian Cook, MD  Calcium Carbonate-Vitamin D (CALCIUM + D PO) Take 600 mg by mouth daily.      Historical Provider, MD  clopidogrel (PLAVIX) 75 MG tablet Take 1 tablet  (75 mg total) by mouth daily. 10/02/11   Vesta MixerPhilip J Nahser, MD  fish oil-omega-3 fatty acids 1000 MG capsule Take 2 g by mouth daily.    Historical Provider, MD  gemfibrozil (LOPID) 600 MG tablet Take 600 mg by mouth 2 (two) times daily before a meal.    Historical Provider, MD  lisinopril (PRINIVIL,ZESTRIL) 20 MG tablet Take 20 mg by mouth daily.      Historical Provider, MD  metoprolol succinate (TOPROL-XL) 25 MG 24 hr tablet Take 1 tablet (25 mg total) by mouth daily. Patient not taking: Reported on 08/12/2015 03/21/11   Vesta MixerPhilip J Nahser, MD  metoprolol tartrate (LOPRESSOR) 25 MG tablet Take 12.5 mg by mouth 2 (two) times daily.    Historical Provider, MD  nitroGLYCERIN (NITROSTAT) 0.4 MG SL tablet Place 0.4 mg under the tongue every 5 (five) minutes as needed for chest pain.     Historical Provider, MD   BP 151/62 mmHg  Pulse 70  Temp(Src) 98.3 F (36.8 C) (Oral)  Resp 18  SpO2 90% Physical Exam  Constitutional: She is oriented to person, place, and time. She appears well-developed and well-nourished. No distress.  HENT:  Head: Normocephalic and atraumatic.  Eyes: Conjunctivae are normal.  Neck: Normal range of motion.  Cardiovascular: Normal rate.   Pulmonary/Chest: Effort normal. No respiratory distress.  Musculoskeletal: Normal range of motion. She exhibits edema.  Mild swelling of right knee. Able to flex knee with passive ROM but sever pain with extension past 120 degrees. No heat or erythema. Strong dorsi and plaarflexion. DP and PT pulses palpable. Sensation intact.  Neurological: She is alert and oriented to person, place, and time.  Skin: Skin is warm and dry.  Psychiatric: She has a normal mood and affect. Her behavior is normal.  Nursing note and vitals reviewed.   ED Course  Procedures   DIAGNOSTIC STUDIES: Oxygen Saturation is 90% on RA, low by my interpretation.    COORDINATION OF CARE: 1:55 PM Consulted with attending MD Dr. Clayborne DanaMesner who advised ordering a x-ray of  right hip. Will order right hip x-ray. Discussed treatment plan with pt at bedside and pt agreed to plan.  3:30 PM Returned to speak with pt about results of right knee and hip imaging. Will order knee sleeve. Discussed ordering crutches and pt declined. Will prescribe tramadol. Will discharge pt.  Imaging Review Dg Knee Complete 4 Views Right  02/17/2016  CLINICAL DATA:  Acute diffuse right knee pain, difficulty ambulating EXAM: RIGHT KNEE - COMPLETE 4+ VIEW COMPARISON:  None available FINDINGS: bones are osteopenic. Mild tricompartmental osteoarthritis, most pronounced in the lateral compartment with joint space loss and bony spurring. No  acute fracture or malalignment. No effusion. Soft tissue calcifications noted. No focal soft tissue swelling. Peripheral atherosclerosis evident. IMPRESSION: Osteopenia and tricompartmental osteoarthritis. No acute fracture or effusion. Electronically Signed   By: Judie Petit.  Shick M.D.   On: 02/17/2016 15:09   Dg Hip Unilat With Pelvis 2-3 Views Right  02/17/2016  CLINICAL DATA:  Pain.  No acute injury EXAM: DG HIP (WITH OR WITHOUT PELVIS) 2-3V RIGHT COMPARISON:  None. FINDINGS: Frontal pelvis as well as frontal and lateral right hip images were obtained. There is no acute fracture or dislocation. There is moderate osteoarthritic change in both hip joints, symmetric. There is no appreciable erosive change. There is also degenerative change in the sacroiliac joints and visualized lower lumbar spine. There are foci of arterial vascular calcification in both superficial femoral arteries. IMPRESSION: Areas of osteoarthritic change. No fracture or dislocation. No erosive change. Vascular calcification consistent with atherosclerosis. Electronically Signed   By: Bretta Bang III M.D.   On: 02/17/2016 15:09   I have personally reviewed and evaluated these images as part of my medical decision-making.    MDM   Final diagnoses:  Right knee pain    Patient X-Ray negative  for obvious fracture or dislocation. Pain managed in ED. Pt advised to follow up with orthopedics if symptoms persist for possibility of missed fracture diagnosis. Patient given brace while in ED, conservative therapy recommended and discussed. Patient will be dc home & is agreeable with above plan.   I personally performed the services described in this documentation, which was scribed in my presence. The recorded information has been reviewed and is accurate.     Arthor Captain, PA-C 02/20/16 1345  Marily Memos, MD 02/20/16 1356

## 2017-04-06 IMAGING — DX DG HIP (WITH OR WITHOUT PELVIS) 2-3V*R*
3 series · 3 of 3 positions shown · non-contrast
Comparison: None.

CLINICAL DATA: Pain.  No acute injury

EXAM:
DG HIP (WITH OR WITHOUT PELVIS) 2-3V RIGHT

[pelvis ap]
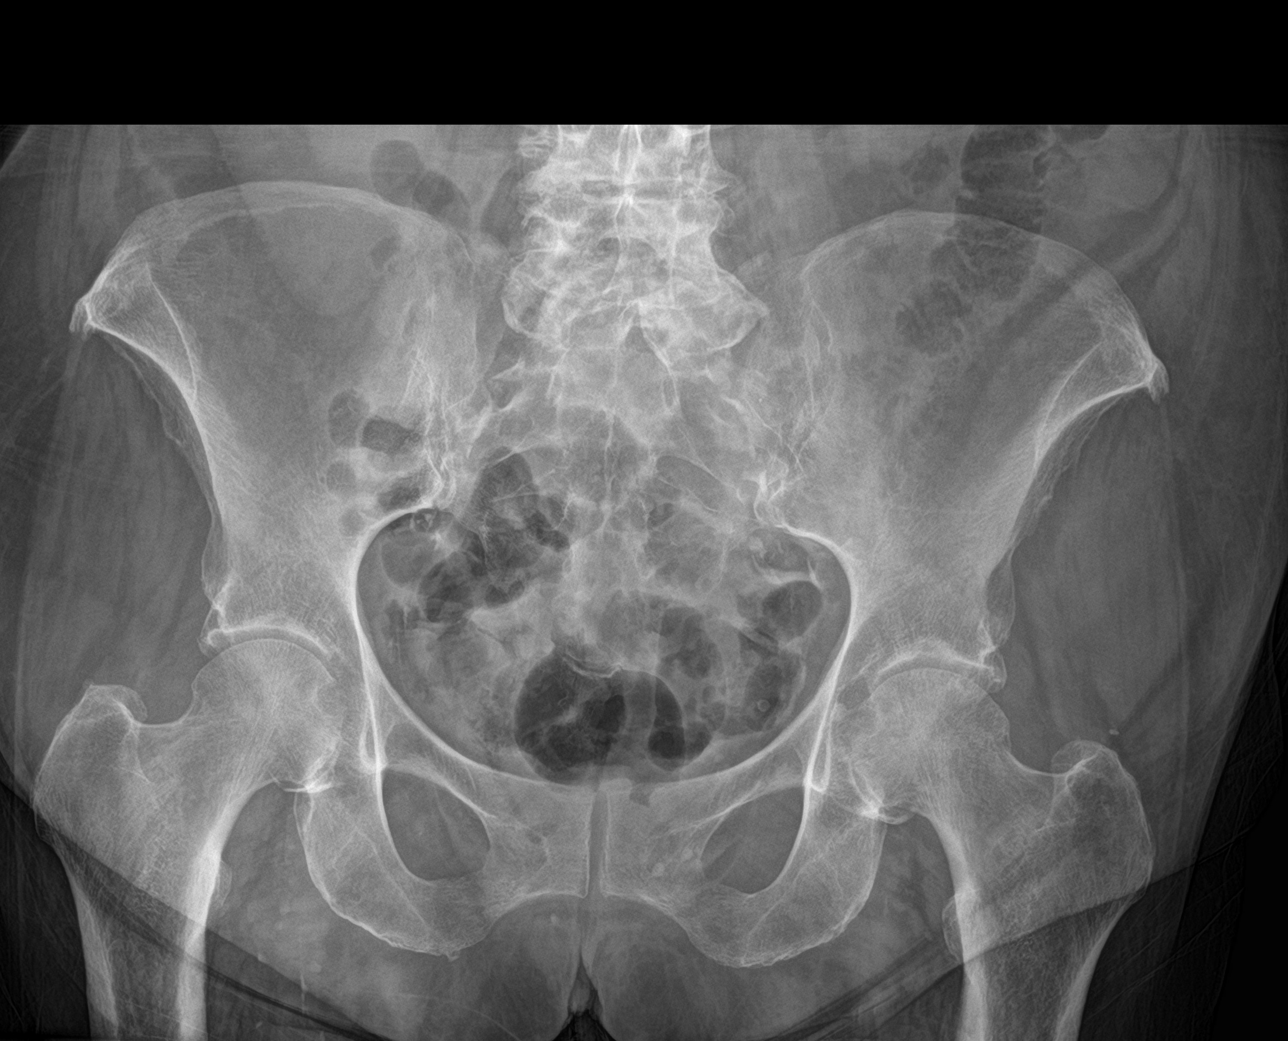

[hip ap]
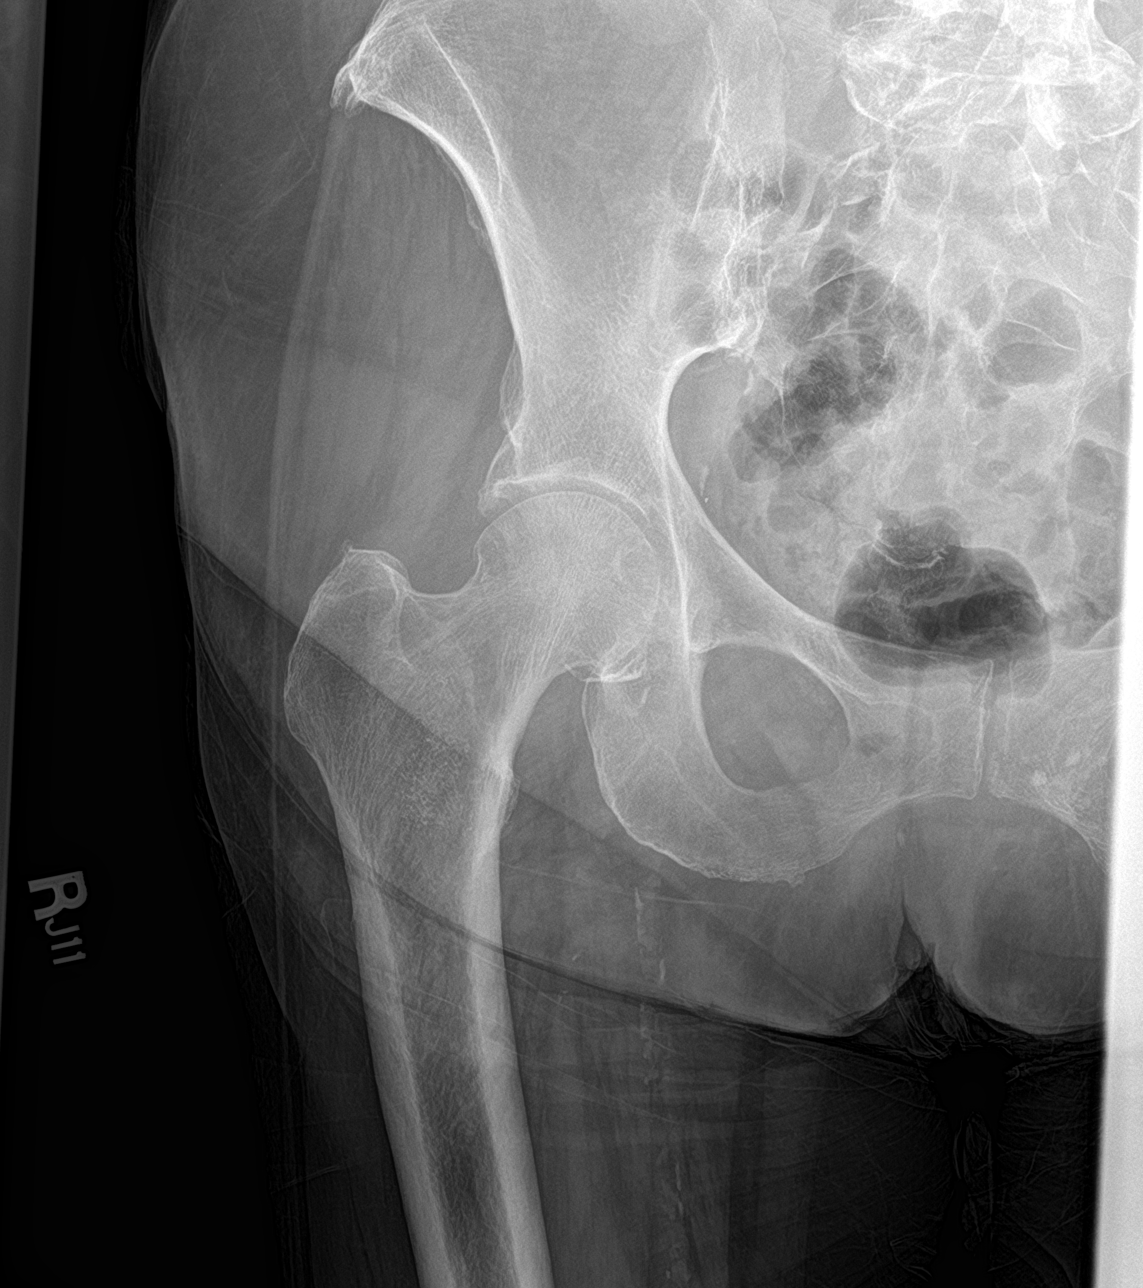

[hip lat]
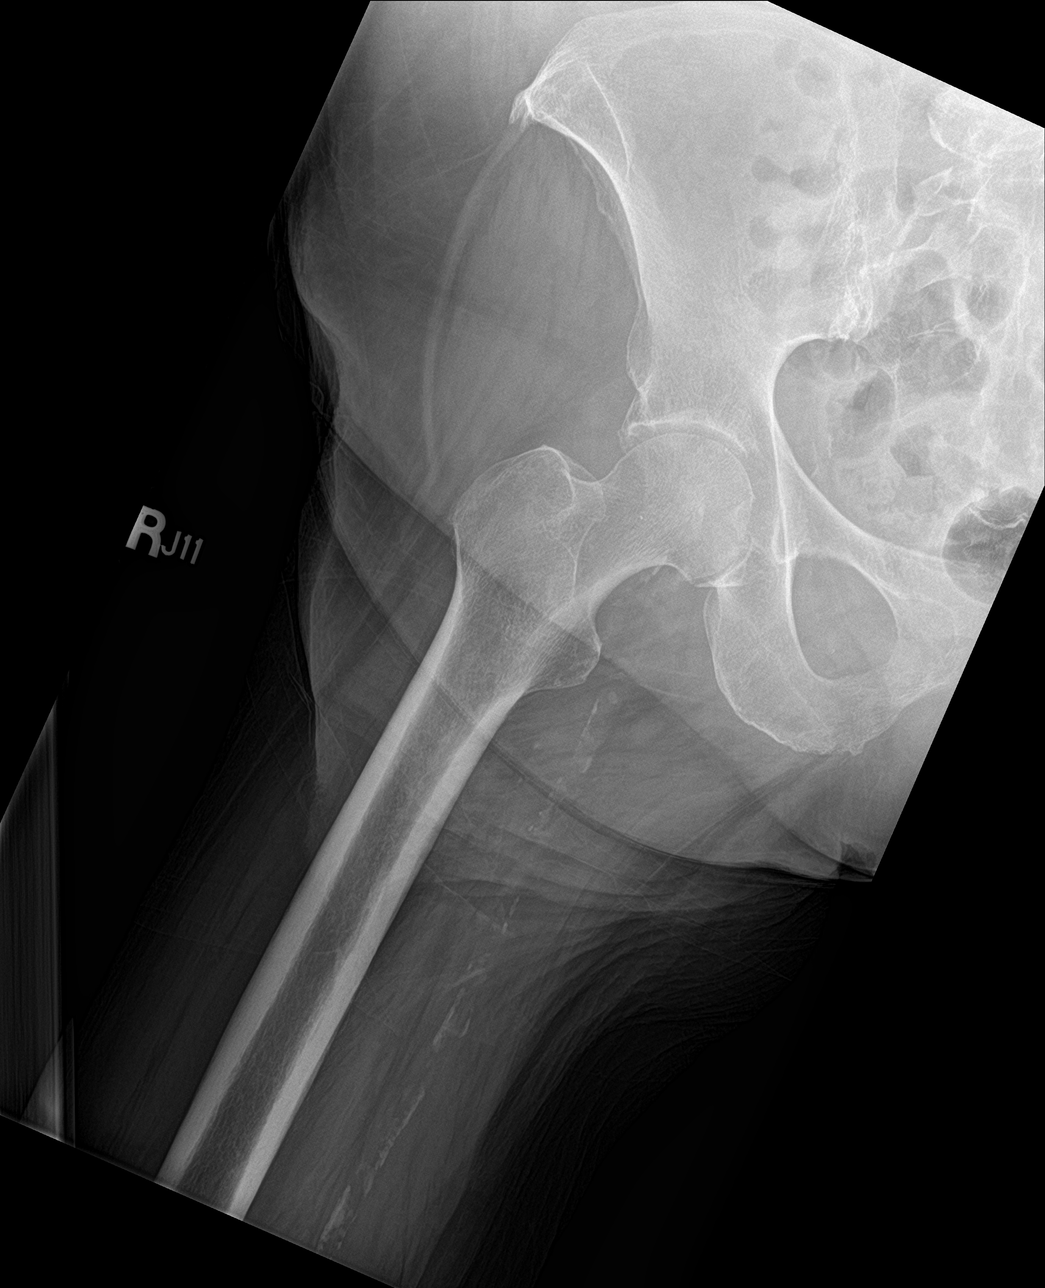

[3 of 3 positions shown; findings below may reference images not displayed]

FINDINGS: Frontal pelvis as well as frontal and lateral right hip images were
obtained. There is no acute fracture or dislocation. There is
moderate osteoarthritic change in both hip joints, symmetric. There
is no appreciable erosive change. There is also degenerative change
in the sacroiliac joints and visualized lower lumbar spine. There
are foci of arterial vascular calcification in both superficial
femoral arteries.
IMPRESSION: Areas of osteoarthritic change. No fracture or dislocation. No
erosive change. Vascular calcification consistent with
atherosclerosis.

## 2020-06-27 DIAGNOSIS — R5383 Other fatigue: Secondary | ICD-10-CM | POA: Diagnosis not present

## 2020-06-27 DIAGNOSIS — Z131 Encounter for screening for diabetes mellitus: Secondary | ICD-10-CM | POA: Diagnosis not present

## 2020-06-27 DIAGNOSIS — E7800 Pure hypercholesterolemia, unspecified: Secondary | ICD-10-CM | POA: Diagnosis not present

## 2020-06-27 DIAGNOSIS — I10 Essential (primary) hypertension: Secondary | ICD-10-CM | POA: Diagnosis not present

## 2020-06-27 DIAGNOSIS — E559 Vitamin D deficiency, unspecified: Secondary | ICD-10-CM | POA: Diagnosis not present

## 2020-07-11 DIAGNOSIS — R0602 Shortness of breath: Secondary | ICD-10-CM | POA: Diagnosis not present

## 2020-07-11 DIAGNOSIS — E559 Vitamin D deficiency, unspecified: Secondary | ICD-10-CM | POA: Diagnosis not present

## 2020-07-11 DIAGNOSIS — Z1231 Encounter for screening mammogram for malignant neoplasm of breast: Secondary | ICD-10-CM | POA: Diagnosis not present

## 2020-07-11 DIAGNOSIS — I10 Essential (primary) hypertension: Secondary | ICD-10-CM | POA: Diagnosis not present

## 2020-07-11 DIAGNOSIS — Z0000 Encounter for general adult medical examination without abnormal findings: Secondary | ICD-10-CM | POA: Diagnosis not present

## 2020-10-03 ENCOUNTER — Ambulatory Visit (INDEPENDENT_AMBULATORY_CARE_PROVIDER_SITE_OTHER): Payer: Medicare Other

## 2020-10-03 ENCOUNTER — Ambulatory Visit
Admission: EM | Admit: 2020-10-03 | Discharge: 2020-10-03 | Disposition: A | Discharge status: Home / Self Care | Payer: Medicare Other | Attending: Family Medicine | Admitting: Family Medicine

## 2020-10-03 ENCOUNTER — Other Ambulatory Visit: Payer: Self-pay

## 2020-10-03 DIAGNOSIS — M25552 Pain in left hip: Secondary | ICD-10-CM

## 2020-10-03 DIAGNOSIS — M1612 Unilateral primary osteoarthritis, left hip: Secondary | ICD-10-CM | POA: Diagnosis not present

## 2020-10-03 DIAGNOSIS — I708 Atherosclerosis of other arteries: Secondary | ICD-10-CM | POA: Diagnosis not present

## 2020-10-03 DIAGNOSIS — I70202 Unspecified atherosclerosis of native arteries of extremities, left leg: Secondary | ICD-10-CM | POA: Diagnosis not present

## 2020-10-03 MED ORDER — DEXAMETHASONE SODIUM PHOSPHATE 10 MG/ML IJ SOLN
10.0000 mg | Freq: Once | INTRAMUSCULAR | Status: AC
  Administered 2020-10-03: 10 mg via INTRAMUSCULAR

## 2020-10-03 MED ORDER — TIZANIDINE HCL 2 MG PO TABS: 2.0000 mg | ORAL_TABLET | Freq: Four times a day (QID) | ORAL | 0 refills | Status: AC | PRN

## 2020-10-03 MED ORDER — PREDNISONE 20 MG PO TABS: 20.0000 mg | ORAL_TABLET | Freq: Every day | ORAL | 0 refills | Status: AC

## 2020-10-03 NOTE — ED Triage Notes (Signed)
Pt presents with complaints of pain in her left hip since Saturday. States she did take a bath and the bathroom is very narrow, she is concerned that she may have turned wrong and pulled a muscle. Pain is worsening. Patient does have a loose bladder and is not sure if that has anything to do with it.

## 2020-10-03 NOTE — Discharge Instructions (Addendum)
Follow-up with the orthopedic office listed on your discharge paperwork if symptoms worsen or do not improve.  Your x-ray did show some chronic arthritic changes to your hip.  Treat inflammation with steroids start the prednisone by mouth tomorrow 20 mg daily for total 5 days.  For pain I prescribed you tizanidine you can take 1 tablet every 6 hours as needed for pain.  Avoid driving while taking medication as it can cause severe drowsiness.

## 2020-10-03 NOTE — ED Provider Notes (Signed)
[name]    CSN: [id number] Arrival date & time: 10/03/20  1412      History   Chief Complaint Chief Complaint  Patient presents with  . Hip Pain    HPI Melanie Mccarty is a 70 y.o. female.   HPI  Patient's medical history significant for CAD, hypertension, and COPD She presents today for evaluation of left hip pain.  Patient was taking a sit down tub bath and reports that her left leg slipped landing the opposing direction as she was attempting to get out of the tub.  She reports no impact or fall however experienced a sensation of something popping when she sustained injury.  Patient has a history of right hip osteoarthritis.  Patient denies any prior issues with the left hip.  She is been taking Tylenol without relief of pain.     Past Medical History:  Diagnosis Date  . Chest pain     consistent with unstable angina  . Coronary artery disease 07/2010   STATUS POST PTCA AND STENTING OF HER RIGHT CORONARY ARTERY  . Hepatitis    Hep B at age 51 yr old  . Hyperlipidemia   . Hypertension   . Tobacco abuse    with possible chronic obstructive pulmonary   disease.  Instructed to follow up with primary care for evaluation   of lung function.   Marland Kitchen Uterine prolapse 08/21/2012    Patient Active Problem List   Diagnosis Date Noted  . Uterine prolapse 08/21/2012  . Pulsatile abdominal mass 06/18/2012  . Smoker 12/04/2010  . Coronary artery disease   . Hypertension     Past Surgical History:  Procedure Laterality Date  . CARDIAC CATHETERIZATION  07/2010   ejection fraction of 60%  . REPAIR VAGINAL CUFF  08/21/2012   Procedure: REPAIR VAGINAL CUFF;  Surgeon: Willodean Rosenthal, MD;  Location: [facility];  Service: Gynecology;  Laterality: N/A;  Exploration and Revision of Vaginal Cuff  . TUBAL LIGATION    . VAGINAL HYSTERECTOMY  08/21/2012   Procedure: HYSTERECTOMY VAGINAL;  Surgeon: Willodean Rosenthal, MD;  Location: [facility];  Service: Gynecology;   Laterality: N/A;    OB History    Gravida  3   Para  3   Term  2   Preterm  1   AB  0   Living  2     SAB      IAB      Ectopic      Multiple      Live Births               Home Medications    Prior to Admission medications   Medication Sig Start Date End Date Taking? Authorizing Provider  albuterol (PROVENTIL HFA;VENTOLIN HFA) 108 (90 Base) MCG/ACT inhaler Inhale 1-2 puffs into the lungs every 6 (six) hours as needed for wheezing or shortness of breath. 08/12/15   Donnetta Hutching, MD  amLODipine (NORVASC) 10 MG tablet Take 10 mg by mouth daily.    [provider]  aspirin 81 MG tablet Take 81 mg by mouth daily.     [provider]  atorvastatin (LIPITOR) 40 MG tablet Take 1 tablet (40 mg total) by mouth daily. Patient not taking: Reported on 08/12/2015 06/20/12   Nahser, Deloris Ping, MD  azithromycin (ZITHROMAX Z-PAK) 250 MG tablet 2 tablets day 1, 1 tablet day 2 through 5. Start Saturday afternoon 08/12/15   Donnetta Hutching, MD  Calcium Carbonate-Vitamin D (CALCIUM +  D PO) Take 600 mg by mouth daily.      [provider]  clopidogrel (PLAVIX) 75 MG tablet Take 1 tablet (75 mg total) by mouth daily. 10/02/11   Nahser, Deloris Ping, MD  fish oil-omega-3 fatty acids 1000 MG capsule Take 2 g by mouth daily.    [provider]  gemfibrozil (LOPID) 600 MG tablet Take 600 mg by mouth 2 (two) times daily before a meal.    [provider]  lisinopril (PRINIVIL,ZESTRIL) 20 MG tablet Take 20 mg by mouth daily.      [provider]  metoprolol succinate (TOPROL-XL) 25 MG 24 hr tablet Take 1 tablet (25 mg total) by mouth daily. Patient not taking: Reported on 08/12/2015 03/21/11   Nahser, Deloris Ping, MD  metoprolol tartrate (LOPRESSOR) 25 MG tablet Take 12.5 mg by mouth 2 (two) times daily.    [provider]  nitroGLYCERIN (NITROSTAT) 0.4 MG SL tablet Place 0.4 mg under the tongue every 5 (five) minutes as needed for chest pain.      [provider]  traMADol (ULTRAM) 50 MG tablet Take 1 tablet (50 mg total) by mouth every 6 (six) hours as needed. 02/17/16   Arthor Captain, PA-C    Family History Family History  Problem Relation Age of Onset  . Heart failure Mother 49  . Hypertension Mother   . Heart failure Father 78  . Diabetes type II Father   . Hypertension Sister   . Heart failure Brother 30    Social History Social History   Tobacco Use  . Smoking status: Current Every Day Smoker    Packs/day: 0.50    Years: 41.00    Pack years: 20.50    Types: Cigarettes  . Smokeless tobacco: Never Used  . Tobacco comment: up to 1 year ago, patient smoked 2 ppds for 40 yrs, now patient smokes 1/2 ppd x 1 yr  Substance Use Topics  . Alcohol use: No  . Drug use: No     Allergies   Penicillins   Review of Systems Review of Systems Pertinent negatives listed in HPI   Physical Exam Triage Vital Signs ED Triage Vitals  Enc Vitals Group     BP 10/03/20 1558 (!) 154/64     Pulse Rate 10/03/20 1558 66     Resp 10/03/20 1558 20     Temp 10/03/20 1558 98.8 F (37.1 C)     Temp Source 10/03/20 1558 Oral     SpO2 10/03/20 1558 91 %     Weight --      Height --      Head Circumference --      Peak Flow --      Pain Score 10/03/20 1602 8     Pain Loc --      Pain Edu? --      Excl. in GC? --    No data found.  Updated Vital Signs BP (!) 154/64 (BP Location: Left Arm)   Pulse 66   Temp 98.8 F (37.1 C) (Oral)   Resp 20   SpO2 91%   Visual Acuity Right Eye Distance:   Left Eye Distance:   Bilateral Distance:    Right Eye Near:   Left Eye Near:    Bilateral Near:     Physical Exam General appearance: Alert, chronically ill appearing, cooperative, no acute distress Head: Normocephalic, without obvious abnormality, atraumatic Respiratory: Respirations even, labored with activity unlabored at rest, unlabored, normal respiratory rate,  generalized diminished lung sounds no advantageous  sounds Heart: Rate and rhythm normal. No gallop or murmurs noted on exam  Musculoskeletal: Kyphosis present, limited range of motion left lower extremity, no visible deformity Skin: Skin color, texture, turgor normal. No rashes seen  Psych: Appropriate mood and affect. Neurologic: GCS 15, antalgic gait present, no cranial nerve deficit  UC Treatments / Results  Labs (all labs ordered are listed, but only abnormal results are displayed) Labs Reviewed - No data to display  EKG   Radiology DG Hip Unilat W or Wo Pelvis 1 View Left  Result Date: 10/03/2020 CLINICAL DATA:  Pain EXAM: DG HIP  1V*L* COMPARISON:  None. FINDINGS: Frontal view obtained. No fracture or dislocation. There is moderate narrowing of the left hip joint. No erosive change. Calcification is noted in the left superficial femoral and profunda femoral arteries. IMPRESSION: Moderate narrowing left hip joint. No fracture or dislocation evident on frontal view. Foci of atherosclerotic arterial vascular calcification noted. Electronically Signed   By: Bretta Bang III M.D.   On: 10/03/2020 17:00    Procedures Procedures (including critical care time)  Medications Ordered in UC Medications - No data to display  Initial Impression / Assessment and Plan / UC Course  I have reviewed the triage vital signs and the nursing notes.  Pertinent labs & imaging results that were available during my care of the patient were reviewed by me and considered in my medical decision making (see chart for details).     Left hip imaging significant for narrowing of the left hip consistent with chronic degenerative disease.  Patient treated with Decadron IM injection here in clinic.  She was start oral prednisone tomorrow morning.  For acute pain tizanidine every 6 hours as needed.  Advised to follow-up with orthopedics if pain does not completely resolve with current treatment.  Information provided to follow-up with [facility]  orthopedics. Final Clinical Impressions(s) / UC Diagnoses   Final diagnoses:  Osteoarthritis of left hip, unspecified osteoarthritis type     Discharge Instructions     Follow-up with the orthopedic office listed on your discharge paperwork if symptoms worsen or do not improve.  Your x-ray did show some chronic arthritic changes to your hip.  Treat inflammation with steroids start the prednisone by mouth tomorrow 20 mg daily for total 5 days.  For pain I prescribed you tizanidine you can take 1 tablet every 6 hours as needed for pain.  Avoid driving while taking medication as it can cause severe drowsiness.    ED Prescriptions    Medication Sig Dispense Auth. Provider   tiZANidine (ZANAFLEX) 2 MG tablet Take 1 tablet (2 mg total) by mouth every 6 (six) hours as needed for muscle spasms. 20 tablet Bing Neighbors, FNP   predniSONE (DELTASONE) 20 MG tablet Take 1 tablet (20 mg total) by mouth daily with breakfast for 5 days. 5 tablet Bing Neighbors, FNP     PDMP not reviewed this encounter.   Bing Neighbors, [place] 10/07/20 [phone]

## 2020-10-11 DIAGNOSIS — M1612 Unilateral primary osteoarthritis, left hip: Secondary | ICD-10-CM | POA: Diagnosis not present

## 2020-10-14 DIAGNOSIS — M1612 Unilateral primary osteoarthritis, left hip: Secondary | ICD-10-CM | POA: Diagnosis not present

## 2020-11-01 DIAGNOSIS — I10 Essential (primary) hypertension: Secondary | ICD-10-CM | POA: Diagnosis not present

## 2020-11-01 DIAGNOSIS — Z760 Encounter for issue of repeat prescription: Secondary | ICD-10-CM | POA: Diagnosis not present

## 2020-11-01 DIAGNOSIS — Z0000 Encounter for general adult medical examination without abnormal findings: Secondary | ICD-10-CM | POA: Diagnosis not present

## 2020-11-01 DIAGNOSIS — E7800 Pure hypercholesterolemia, unspecified: Secondary | ICD-10-CM | POA: Diagnosis not present

## 2020-11-02 DIAGNOSIS — R9431 Abnormal electrocardiogram [ECG] [EKG]: Secondary | ICD-10-CM | POA: Diagnosis not present

## 2020-11-08 DIAGNOSIS — R9431 Abnormal electrocardiogram [ECG] [EKG]: Secondary | ICD-10-CM | POA: Diagnosis not present

## 2020-11-08 DIAGNOSIS — F17210 Nicotine dependence, cigarettes, uncomplicated: Secondary | ICD-10-CM | POA: Diagnosis not present

## 2020-11-14 DIAGNOSIS — Z1211 Encounter for screening for malignant neoplasm of colon: Secondary | ICD-10-CM | POA: Diagnosis not present

## 2020-11-14 DIAGNOSIS — Z1212 Encounter for screening for malignant neoplasm of rectum: Secondary | ICD-10-CM | POA: Diagnosis not present

## 2022-10-04 DIAGNOSIS — R011 Cardiac murmur, unspecified: Secondary | ICD-10-CM | POA: Diagnosis not present

## 2022-10-04 DIAGNOSIS — E559 Vitamin D deficiency, unspecified: Secondary | ICD-10-CM | POA: Diagnosis not present

## 2022-10-04 DIAGNOSIS — Z720 Tobacco use: Secondary | ICD-10-CM | POA: Diagnosis not present

## 2022-10-04 DIAGNOSIS — Z0001 Encounter for general adult medical examination with abnormal findings: Secondary | ICD-10-CM | POA: Diagnosis not present

## 2022-10-04 DIAGNOSIS — E782 Mixed hyperlipidemia: Secondary | ICD-10-CM | POA: Diagnosis not present

## 2022-10-04 DIAGNOSIS — I2510 Atherosclerotic heart disease of native coronary artery without angina pectoris: Secondary | ICD-10-CM | POA: Diagnosis not present

## 2022-10-04 DIAGNOSIS — I10 Essential (primary) hypertension: Secondary | ICD-10-CM | POA: Diagnosis not present

## 2023-07-10 DIAGNOSIS — I2510 Atherosclerotic heart disease of native coronary artery without angina pectoris: Secondary | ICD-10-CM | POA: Diagnosis not present

## 2023-07-10 DIAGNOSIS — Z720 Tobacco use: Secondary | ICD-10-CM | POA: Diagnosis not present

## 2023-07-10 DIAGNOSIS — R739 Hyperglycemia, unspecified: Secondary | ICD-10-CM | POA: Diagnosis not present

## 2023-07-10 DIAGNOSIS — E559 Vitamin D deficiency, unspecified: Secondary | ICD-10-CM | POA: Diagnosis not present

## 2023-07-10 DIAGNOSIS — I10 Essential (primary) hypertension: Secondary | ICD-10-CM | POA: Diagnosis not present

## 2023-07-10 DIAGNOSIS — Z0000 Encounter for general adult medical examination without abnormal findings: Secondary | ICD-10-CM | POA: Diagnosis not present

## 2023-07-10 DIAGNOSIS — E782 Mixed hyperlipidemia: Secondary | ICD-10-CM | POA: Diagnosis not present

## 2023-07-10 DIAGNOSIS — R011 Cardiac murmur, unspecified: Secondary | ICD-10-CM | POA: Diagnosis not present

## 2023-08-07 DIAGNOSIS — N179 Acute kidney failure, unspecified: Secondary | ICD-10-CM | POA: Diagnosis not present

## 2023-08-07 DIAGNOSIS — R011 Cardiac murmur, unspecified: Secondary | ICD-10-CM | POA: Diagnosis not present

## 2023-08-07 DIAGNOSIS — Z720 Tobacco use: Secondary | ICD-10-CM | POA: Diagnosis not present

## 2023-08-07 DIAGNOSIS — E559 Vitamin D deficiency, unspecified: Secondary | ICD-10-CM | POA: Diagnosis not present

## 2023-08-07 DIAGNOSIS — D582 Other hemoglobinopathies: Secondary | ICD-10-CM | POA: Diagnosis not present

## 2023-08-07 DIAGNOSIS — I10 Essential (primary) hypertension: Secondary | ICD-10-CM | POA: Diagnosis not present

## 2023-08-07 DIAGNOSIS — I2510 Atherosclerotic heart disease of native coronary artery without angina pectoris: Secondary | ICD-10-CM | POA: Diagnosis not present

## 2023-08-07 DIAGNOSIS — E782 Mixed hyperlipidemia: Secondary | ICD-10-CM | POA: Diagnosis not present

## 2023-09-11 DIAGNOSIS — N179 Acute kidney failure, unspecified: Secondary | ICD-10-CM | POA: Diagnosis not present

## 2023-10-16 DIAGNOSIS — I2510 Atherosclerotic heart disease of native coronary artery without angina pectoris: Secondary | ICD-10-CM | POA: Diagnosis not present

## 2023-10-16 DIAGNOSIS — Z0001 Encounter for general adult medical examination with abnormal findings: Secondary | ICD-10-CM | POA: Diagnosis not present

## 2023-10-16 DIAGNOSIS — I10 Essential (primary) hypertension: Secondary | ICD-10-CM | POA: Diagnosis not present

## 2023-10-16 DIAGNOSIS — E559 Vitamin D deficiency, unspecified: Secondary | ICD-10-CM | POA: Diagnosis not present

## 2023-10-16 DIAGNOSIS — Z720 Tobacco use: Secondary | ICD-10-CM | POA: Diagnosis not present

## 2023-10-16 DIAGNOSIS — D751 Secondary polycythemia: Secondary | ICD-10-CM | POA: Diagnosis not present

## 2023-10-16 DIAGNOSIS — E782 Mixed hyperlipidemia: Secondary | ICD-10-CM | POA: Diagnosis not present

## 2023-10-16 DIAGNOSIS — J410 Simple chronic bronchitis: Secondary | ICD-10-CM | POA: Diagnosis not present

## 2023-12-17 ENCOUNTER — Ambulatory Visit: Admitting: Internal Medicine

## 2023-12-20 ENCOUNTER — Other Ambulatory Visit: Payer: Self-pay | Admitting: Physician Assistant

## 2023-12-20 DIAGNOSIS — Z1231 Encounter for screening mammogram for malignant neoplasm of breast: Secondary | ICD-10-CM

## 2023-12-27 ENCOUNTER — Ambulatory Visit: Admitting: Cardiology

## 2024-02-13 ENCOUNTER — Other Ambulatory Visit: Payer: Self-pay | Admitting: Family Medicine

## 2024-02-13 DIAGNOSIS — F17200 Nicotine dependence, unspecified, uncomplicated: Secondary | ICD-10-CM

## 2024-03-25 ENCOUNTER — Encounter: Payer: Self-pay | Admitting: Family Medicine

## 2024-03-25 ENCOUNTER — Inpatient Hospital Stay
Admission: RE | Admit: 2024-03-25 | Discharge: 2024-03-25 | Disposition: A | Discharge status: Home / Self Care | Source: Ambulatory Visit | Attending: Family Medicine

## 2024-03-25 DIAGNOSIS — F17200 Nicotine dependence, unspecified, uncomplicated: Secondary | ICD-10-CM
